# Patient Record
Sex: Male | Born: 1975 | Race: White | Hispanic: No | Marital: Married | Smoking: Current every day smoker
Health system: Southern US, Community
[De-identification: ages and names within clinical notes are randomized; demographics above are authoritative.]

## PROBLEM LIST (undated history)

## (undated) DIAGNOSIS — F909 Attention-deficit hyperactivity disorder, unspecified type: Secondary | ICD-10-CM

## (undated) HISTORY — DX: Attention-deficit hyperactivity disorder, unspecified type: F90.9

## (undated) HISTORY — PX: WISDOM TOOTH EXTRACTION: SHX21

---

## 2008-02-04 ENCOUNTER — Emergency Department: Payer: Self-pay | Admitting: Emergency Medicine

## 2009-10-24 ENCOUNTER — Emergency Department: Payer: Self-pay | Admitting: Emergency Medicine

## 2009-12-07 ENCOUNTER — Emergency Department: Payer: Self-pay | Admitting: Emergency Medicine

## 2011-03-31 IMAGING — CT CT CERVICAL SPINE WITHOUT CONTRAST
1 series · 12 of 14 positions shown, 15 images · non-contrast
Comparison: none

REASON FOR EXAM: hyperflexion injury to neck
COMMENTS:   LMP: (Male)

PROCEDURE:     CT  - CT CERVICAL SPINE WO  - October 24, 2009  [DATE]
RESULT:     CT cervical spine
TECHNIQUE: Helical 2 mm sections were obtained. Reconstructions were
performed utilizing a bone algorithm in coronal, sagittal, and axial planes.

[Series 7: axial · axial · 0.23mm/px · z∈[+385,+567]mm · 12 of 111 slices shown, 15 images]
[im 9/111  soft-tissue]
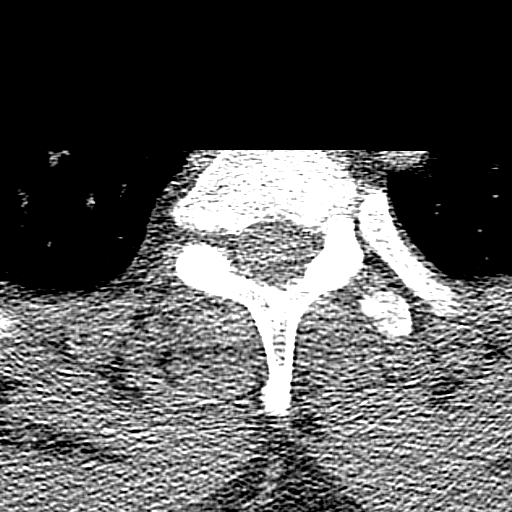
[im 9/111  bone]
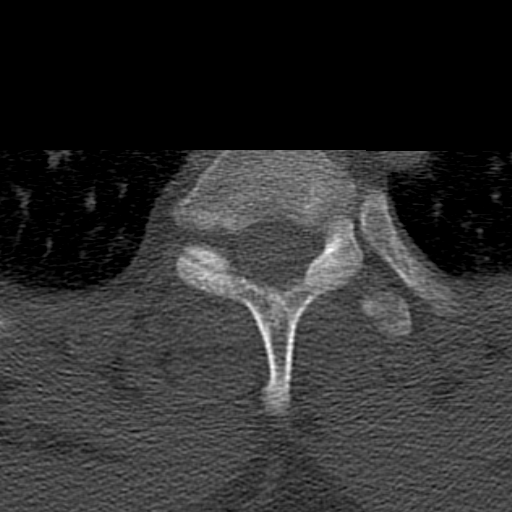
[im 17/111  bone]
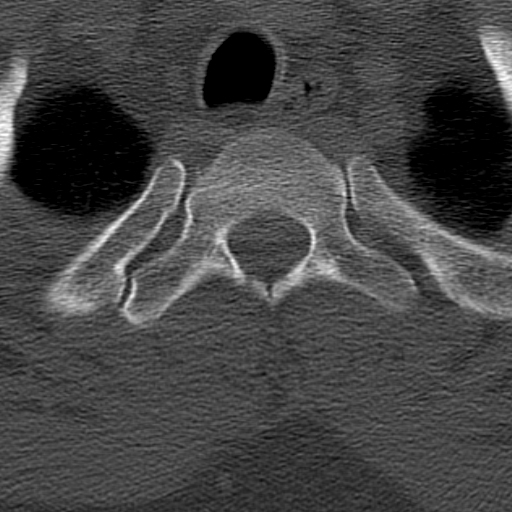
[im 26/111  bone]
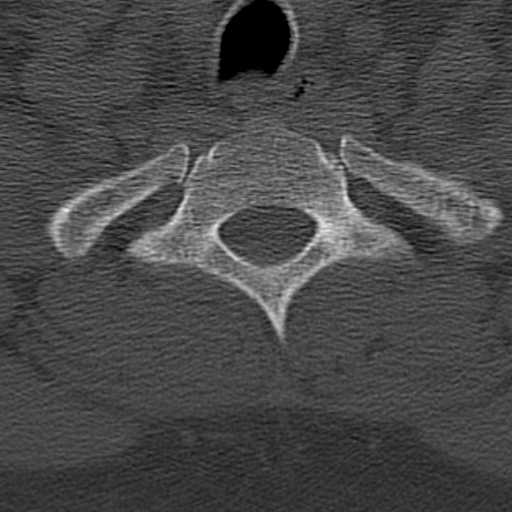
[im 34/111  bone]
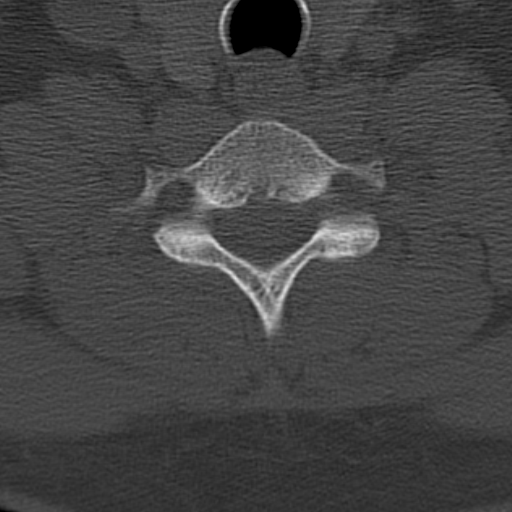
[im 43/111  soft-tissue]
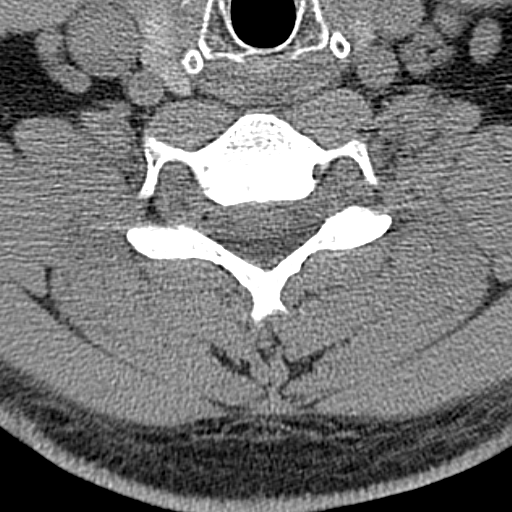
[im 43/111  bone]
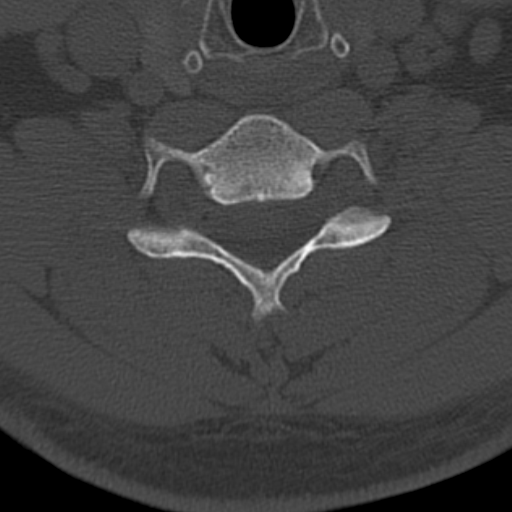
[im 51/111  bone]
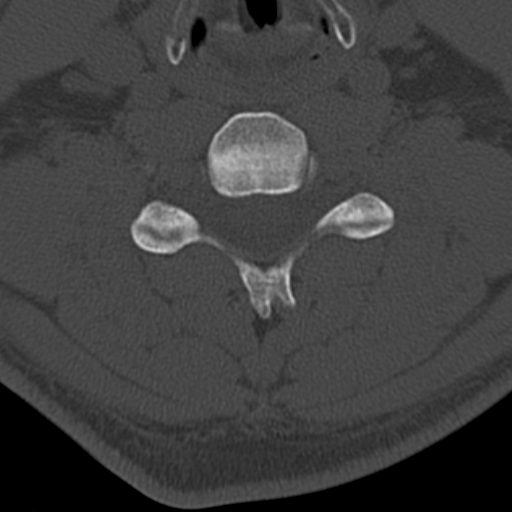
[im 60/111  bone]
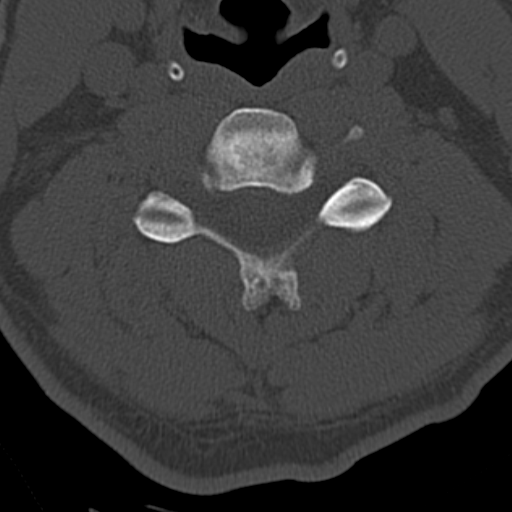
[im 68/111  bone]
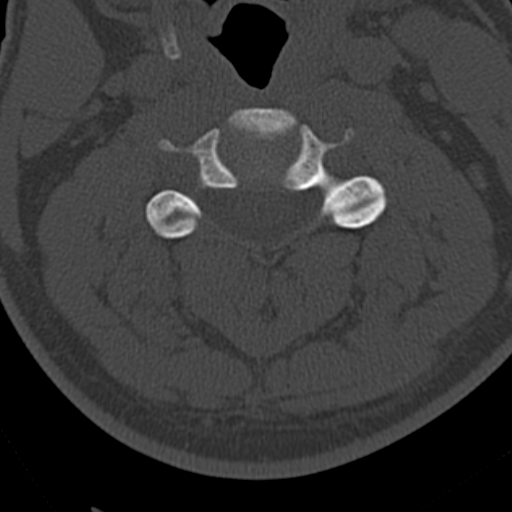
[im 77/111  soft-tissue]
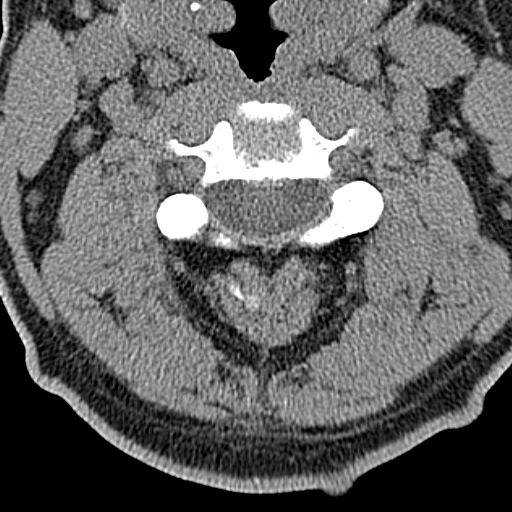
[im 77/111  bone]
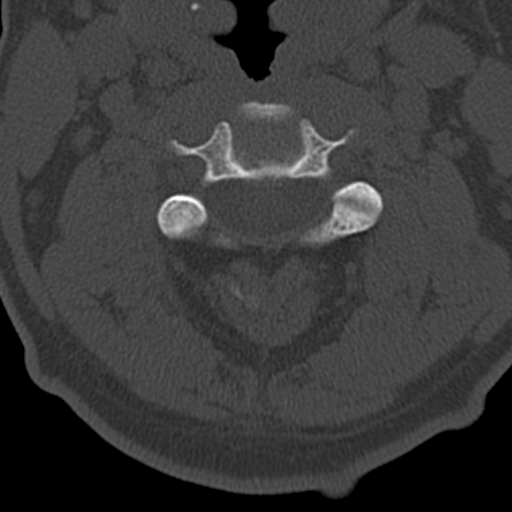
[im 85/111  bone]
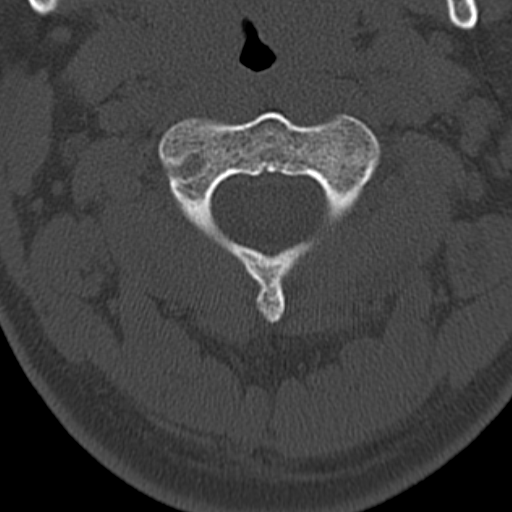
[im 94/111  bone]
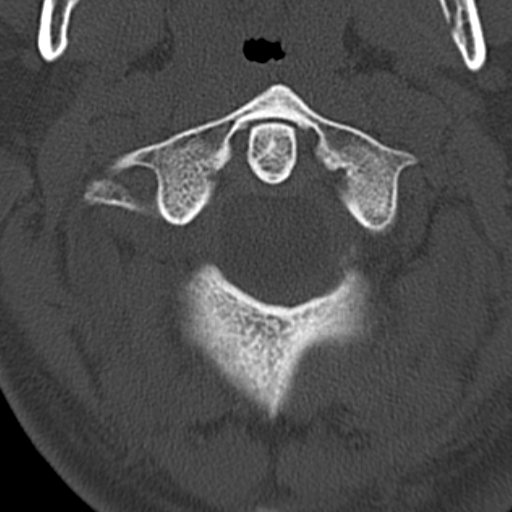
[im 102/111  bone]
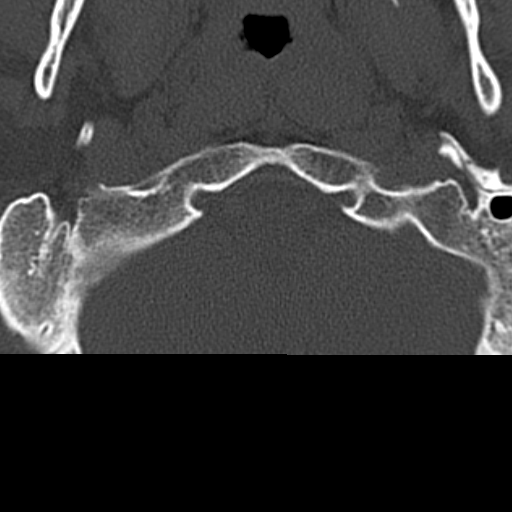

[12 of 14 positions shown; findings below may reference images not displayed]

FINDINGS: There is no evidence of acute fracture, dislocation, or
malalignment. There is no evidence of prevertebral soft tissue swelling nor
evidence of canal stenosis. There is straightening of the normal cervical
lordosis which may be secondary to positioning, collar placement, and/or
possibly muscle spasm.
IMPRESSION: No CT evidence of acute osseous abnormalities.

## 2011-05-15 IMAGING — CR DG CHEST 2V
1 series · 3 of 3 positions shown · non-contrast
Comparison: none

REASON FOR EXAM: need pa and lat for chest pain
COMMENTS:

PROCEDURE:     DXR - DXR CHEST PA (OR AP) AND LATERAL  - December 08, 2009  [DATE]
RESULT:     Comparison: None

[Series 1: view not recorded · 0.17mm/px · 3 of 3 slices shown]
[im 1/3]
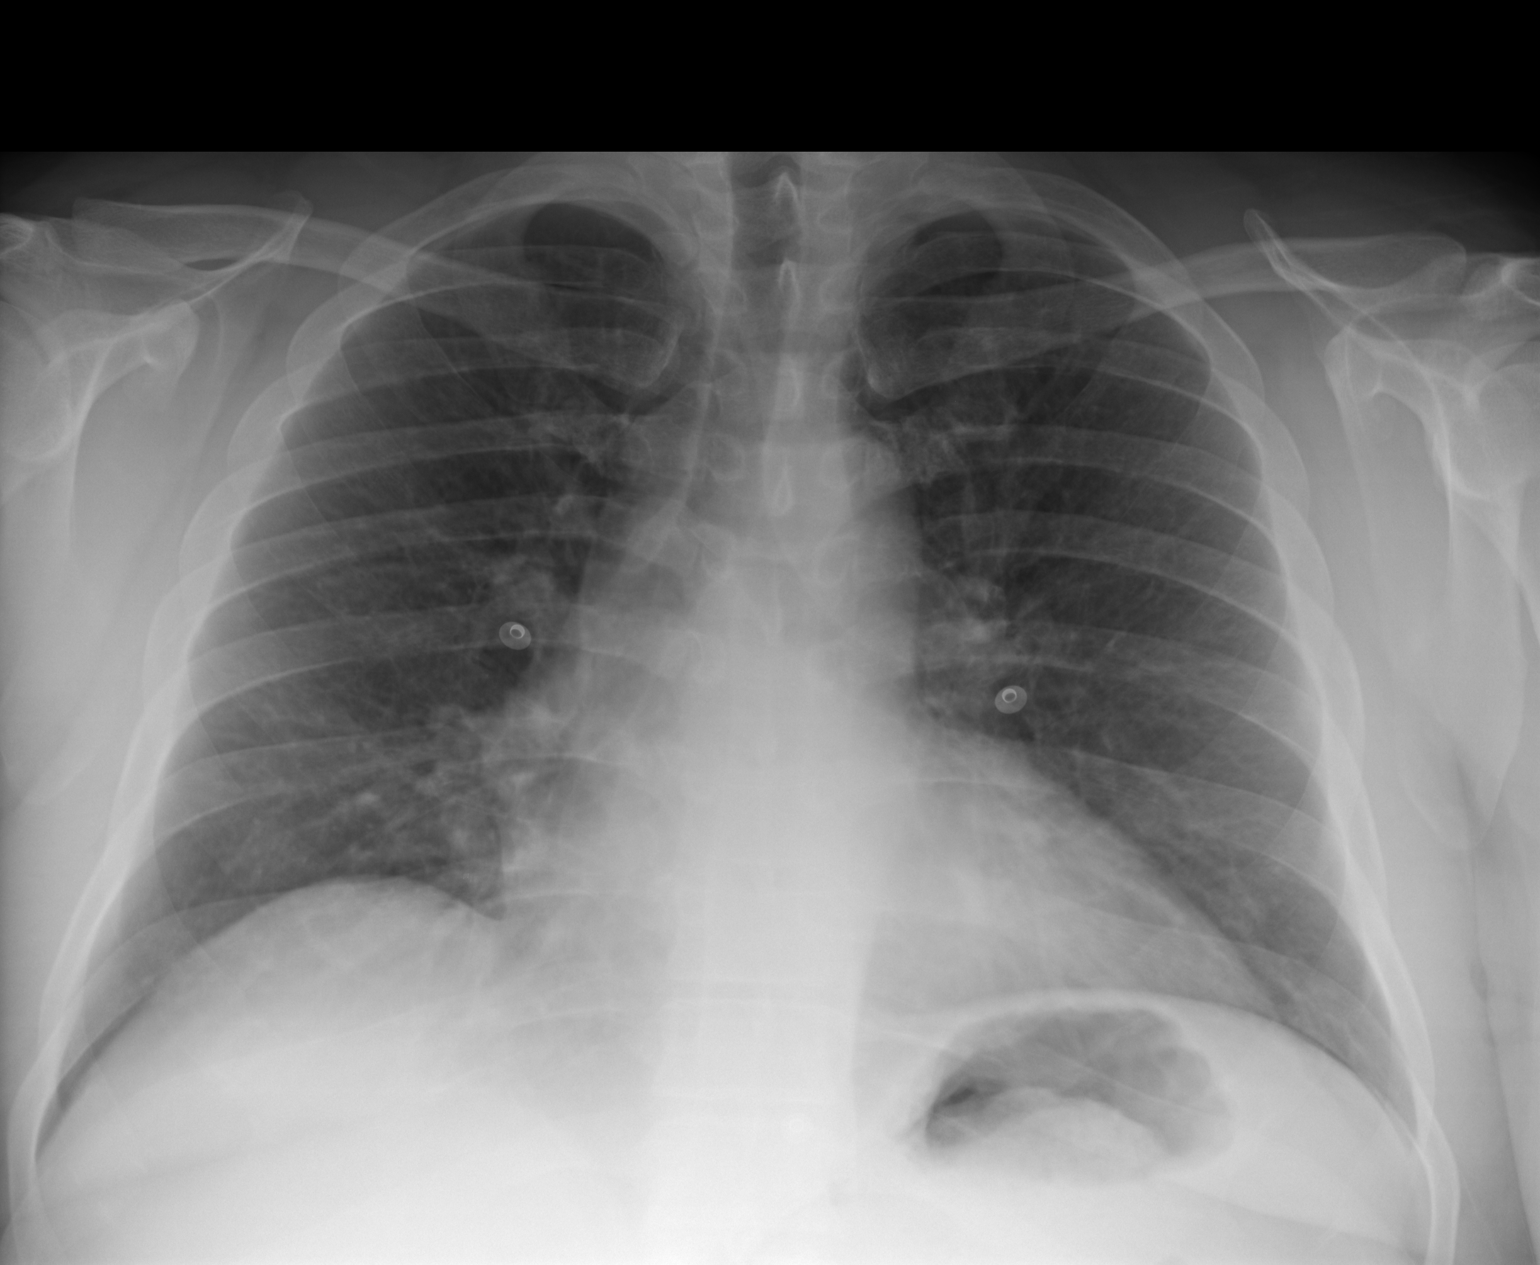
[im 2/3]
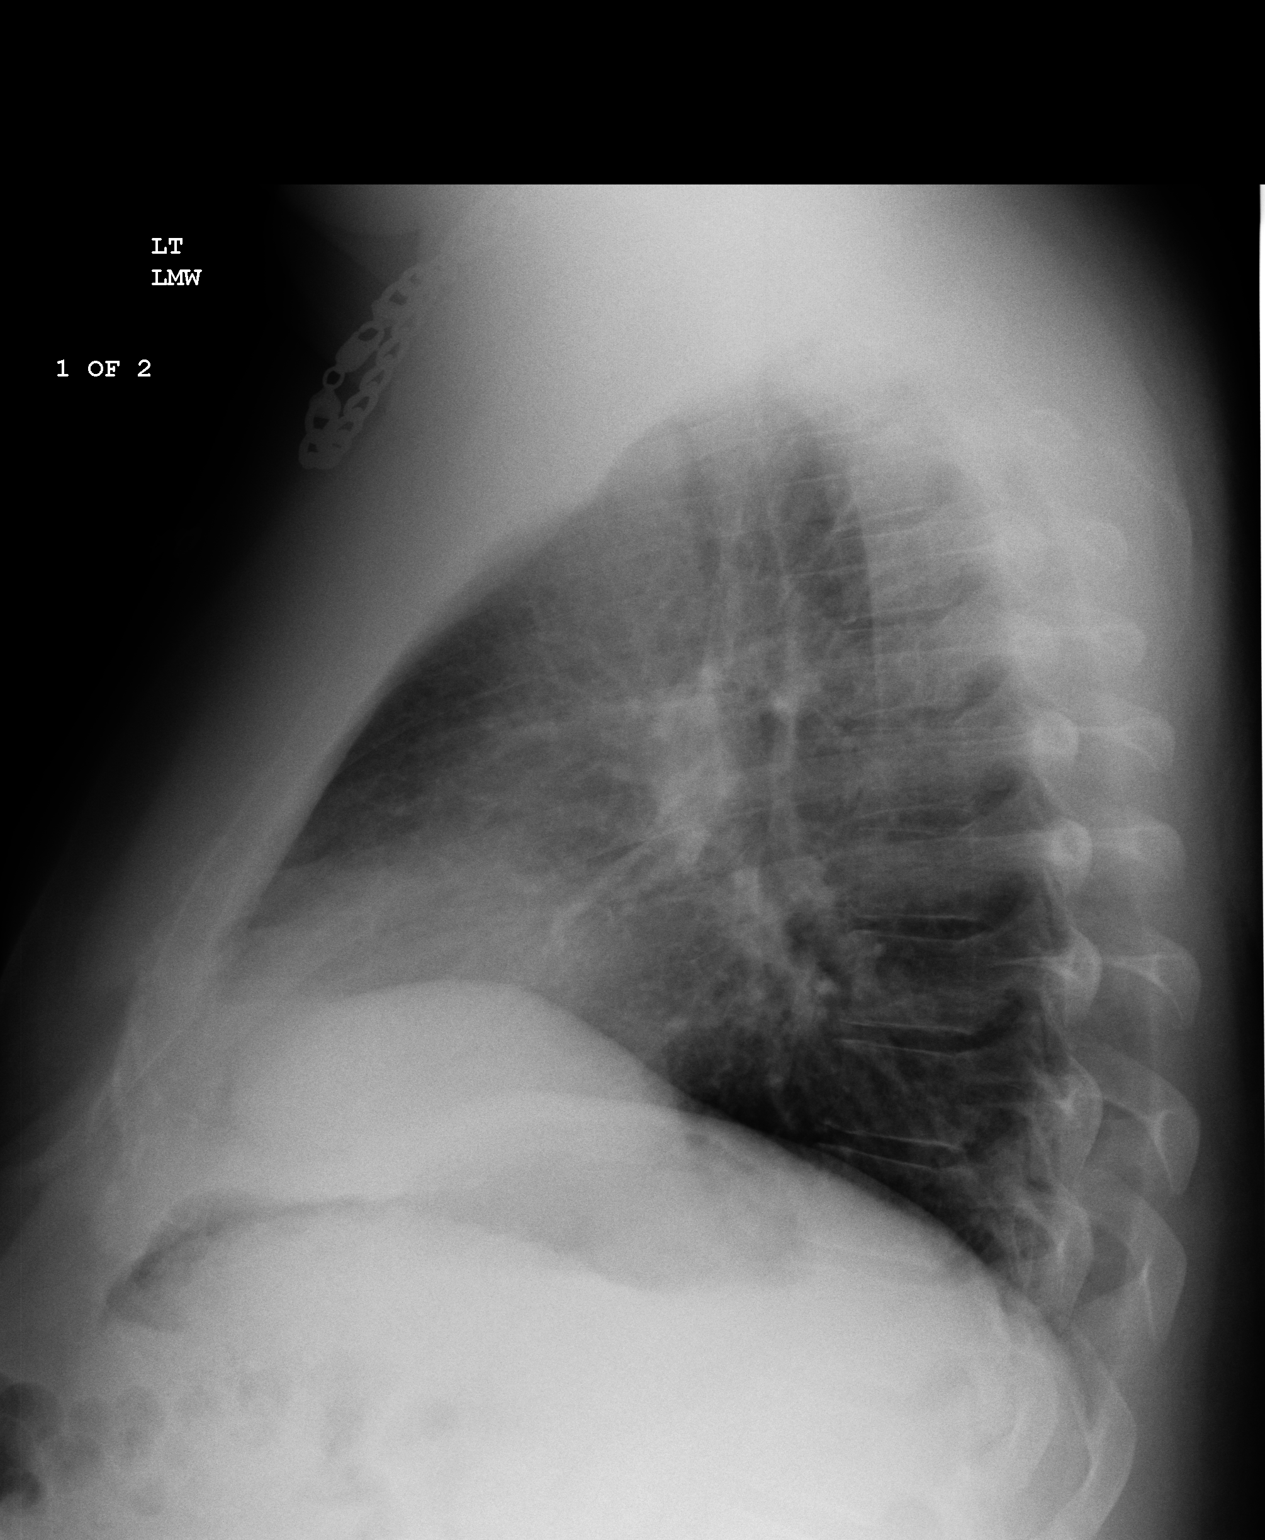
[im 3/3]
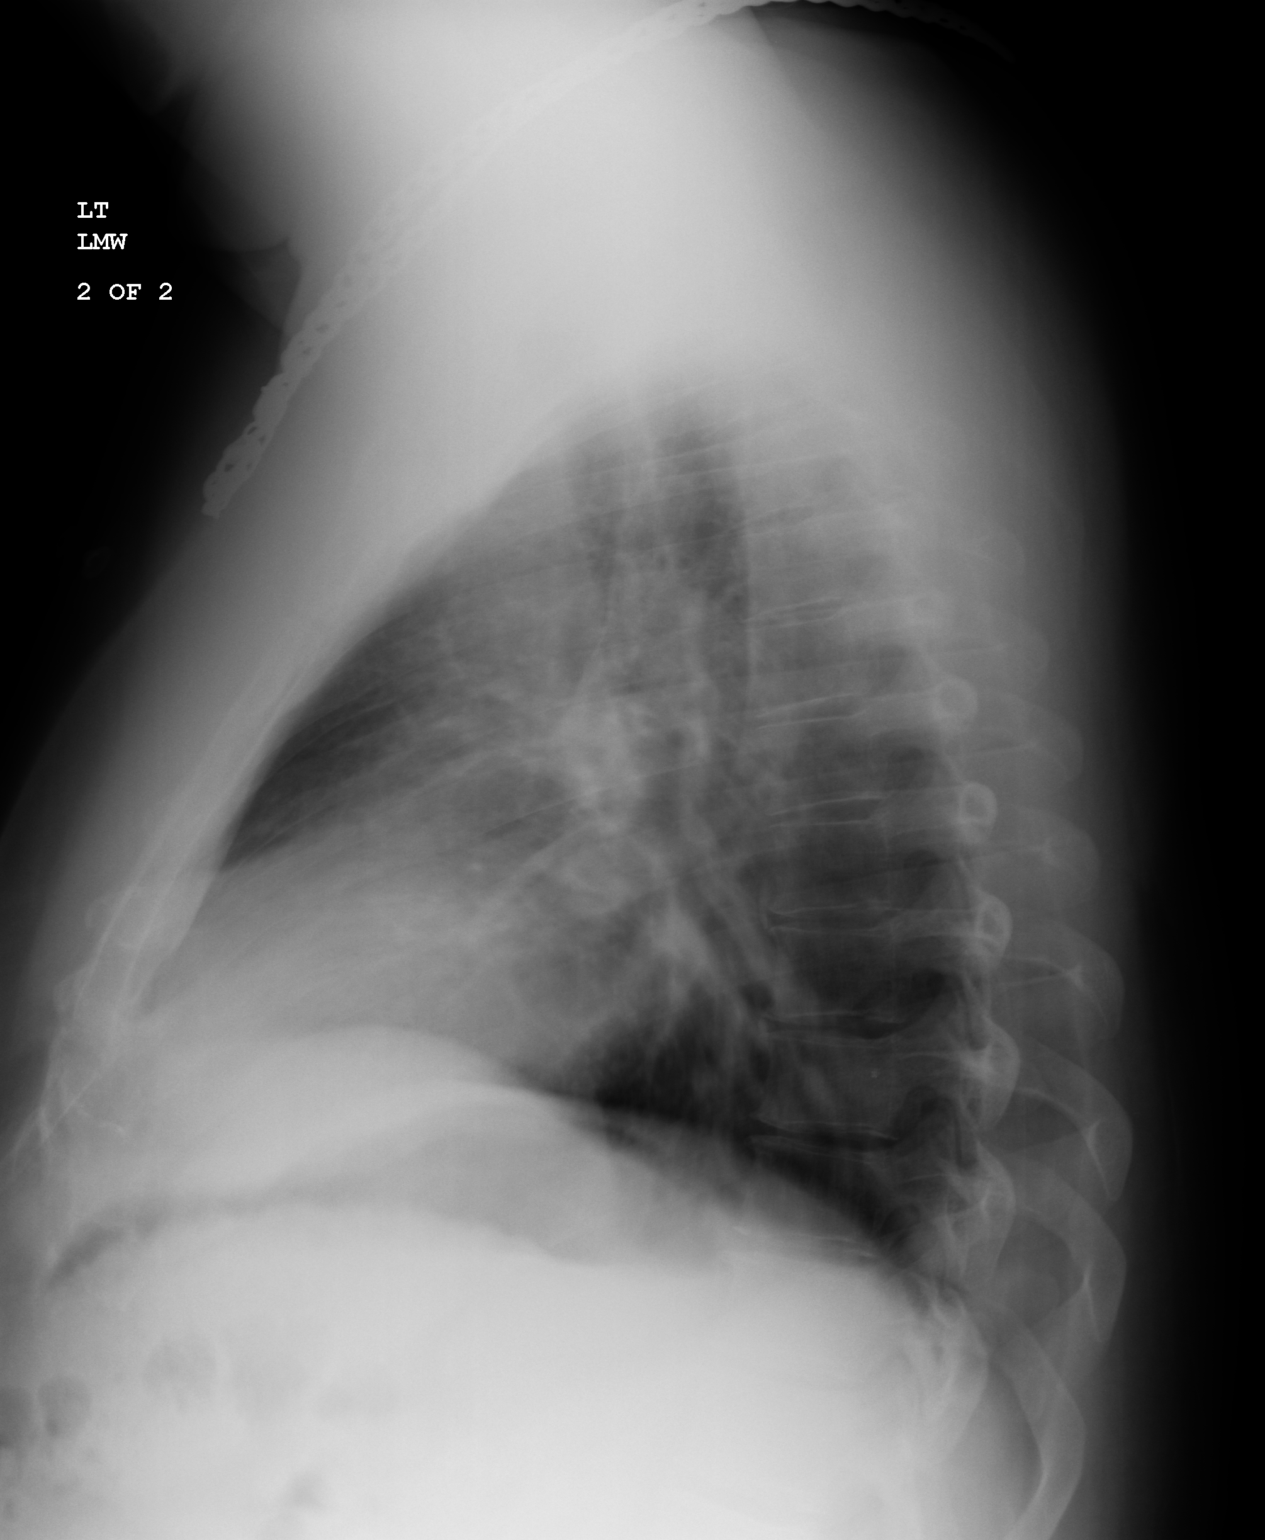

[3 of 3 positions shown; findings below may reference images not displayed]

FINDINGS: Heart size is mildly prominent, likely related to low lung volumes. The
lungs are clear.
IMPRESSION: No acute cardio pulmonary disease.

## 2017-10-02 ENCOUNTER — Ambulatory Visit: Payer: Managed Care, Other (non HMO) | Admitting: Family Medicine

## 2017-10-02 ENCOUNTER — Encounter: Payer: Self-pay | Admitting: Family Medicine

## 2017-10-02 VITALS — BP 118/84 | HR 77 | Temp 97.4°F | Resp 16 | Ht 69.25 in | Wt 322.0 lb

## 2017-10-02 DIAGNOSIS — Z720 Tobacco use: Secondary | ICD-10-CM | POA: Diagnosis not present

## 2017-10-02 DIAGNOSIS — F909 Attention-deficit hyperactivity disorder, unspecified type: Secondary | ICD-10-CM

## 2017-10-02 DIAGNOSIS — L409 Psoriasis, unspecified: Secondary | ICD-10-CM

## 2017-10-02 DIAGNOSIS — Z1329 Encounter for screening for other suspected endocrine disorder: Secondary | ICD-10-CM

## 2017-10-02 DIAGNOSIS — Z13220 Encounter for screening for lipoid disorders: Secondary | ICD-10-CM | POA: Diagnosis not present

## 2017-10-02 DIAGNOSIS — F988 Other specified behavioral and emotional disorders with onset usually occurring in childhood and adolescence: Secondary | ICD-10-CM | POA: Insufficient documentation

## 2017-10-02 DIAGNOSIS — Z6841 Body Mass Index (BMI) 40.0 and over, adult: Secondary | ICD-10-CM

## 2017-10-02 DIAGNOSIS — E669 Obesity, unspecified: Secondary | ICD-10-CM | POA: Insufficient documentation

## 2017-10-02 MED ORDER — CLOBETASOL PROPIONATE 0.05 % EX OINT: 1.0000 "application " | TOPICAL_OINTMENT | Freq: Two times a day (BID) | CUTANEOUS | 5 refills | Status: DC

## 2017-10-02 NOTE — Assessment & Plan Note (Signed)
Discussed importance of diet and exercise Will screen for lipids, diabetes BP well controlled

## 2017-10-02 NOTE — Assessment & Plan Note (Signed)
Reported diagnosis Does have classic symptoms of inattention and distractability Will send for Neuropsych testing to confirm and then f/u to discuss medications Would try Ritalin as this has worked well in the past

## 2017-10-02 NOTE — Assessment & Plan Note (Signed)
Encourage hydration and moisturizer Clobetasol ointment BID to affected areas No signs/symptoms of PsA If covers larger area in the future, consider Derm referral for possible biologic therapy

## 2017-10-02 NOTE — Patient Instructions (Signed)
Psoriasis Psoriasis is a long-term (chronic) condition of skin inflammation. It occurs because your immune system causes skin cells to form too quickly. As a result, too many skin cells grow and create raised, red patches (plaques) that look silvery on your skin. Plaques may appear anywhere on your body. They can be any size or shape. Psoriasis can come and go. The condition varies from mild to very severe. It cannot be passed from one person to another (not contagious). What are the causes? The cause of psoriasis is not known, but certain factors can make the condition worse. These include:  Damage or trauma to the skin, such as cuts, scrapes, sunburn, and dryness.  Lack of sunlight.  Certain medicines.  Alcohol.  Tobacco use.  Stress.  Infections caused by bacteria or viruses.  What increases the risk? This condition is more likely to develop in:  People with a family history of psoriasis.  People who are Caucasian.  People who are between the ages of 15-30 and 50-60 years old.  What are the signs or symptoms? There are five different types of psoriasis. You can have more than one type of psoriasis during your life. Types are:  Plaque.  Guttate.  Inverse.  Pustular.  Erythrodermic.  Each type of psoriasis has different symptoms.  Plaque psoriasis symptoms include red, raised plaques with a silvery white coating (scale). These plaques may be itchy. Your nails may be pitted and crumbly or fall off.  Guttate psoriasis symptoms include small red spots that often show up on your trunk, arms, and legs. These spots may develop after you have been sick, especially with strep throat.  Inverse psoriasis symptoms include plaques in your underarm area, under your breasts, or on your genitals, groin, or buttocks.  Pustular psoriasis symptoms include pus-filled bumps that are painful, red, and swollen on the palms of your hands or the soles of your feet. You also may feel  exhausted, feverish, weak, or have no appetite.  Erythrodermic psoriasis symptoms include bright red skin that may look burned. You may have a fast heartbeat and a body temperature that is too high or too low. You may be itchy or in pain.  How is this diagnosed? Your health care provider may suspect psoriasis based on your symptoms and family history. Your health care provider will also do a physical exam. This may include a procedure to remove a tissue sample (biopsy) for testing. You may also be referred to a health care provider who specializes in skin diseases (dermatologist). How is this treated? There is no cure for this condition, but treatment can help manage it. Goals of treatment include:  Helping your skin heal.  Reducing itching and inflammation.  Slowing the growth of new skin cells.  Helping your immune system respond better to your skin.  Treatment varies, depending on the severity of your condition. Treatment may include:  Creams or ointments.  Ultraviolet ray exposure (light therapy). This may include natural sunlight or light therapy in a medical office.  Medicines (systemic therapy). These medicines can help your body better manage skin cell turnover and inflammation. They may be used along with light therapy or ointments. You may also get antibiotic medicines if you have an infection.  Follow these instructions at home: Skin Care  Moisturize your skin as needed. Only use moisturizers that have been approved by your health care provider.  Apply cool compresses to the affected areas.  Do not scratch your skin. Lifestyle   Do not   use tobacco products. This includes cigarettes, chewing tobacco, and e-cigarettes. If you need help quitting, ask your health care provider.  Drink little or no alcohol.  Try techniques for stress reduction, such as meditation or yoga.  Get exposure to the sun as told by your health care provider. Do not get sunburned.  Consider  joining a psoriasis support group. Medicines  Take or use over-the-counter and prescription medicines only as told by your health care provider.  If you were prescribed an antibiotic, take or use it as told by your health care provider. Do not stop taking the antibiotic even if your condition starts to improve. General instructions  Keep a journal to help track what triggers an outbreak. Try to avoid any triggers.  See a counselor or social worker if feelings of sadness, frustration, and hopelessness about your condition are interfering with your work and relationships.  Keep all follow-up visits as told by your health care provider. This is important. Contact a health care provider if:  Your pain gets worse.  You have increasing redness or warmth in the affected areas.  You have new or worsening pain or stiffness in your joints.  Your nails start to break easily or pull away from the nail bed.  You have a fever.  You feel depressed. This information is not intended to replace advice given to you by your health care provider. Make sure you discuss any questions you have with your health care provider. Document Released: 02/15/2000 Document Revised: 07/26/2015 Document Reviewed: 07/05/2014 Elsevier Interactive Patient Education  2018 Elsevier Inc[employer]  

## 2017-10-02 NOTE — Progress Notes (Signed)
Patient: Bradley Scott, Male    DOB: [birth year], 42 y.o.   MRN: [id number] Visit Date: 10/02/2017  Today's Provider: Shirlee Latch, MD   I, Joslyn Hy, CMA, am acting as scribe for Shirlee Latch, MD.  Chief Complaint  Patient presents with  . New Patient (Initial Visit)   Subjective:    New Patient Bradley Scott is a 42 y.o. male who presents today as a new patient to establish care. He feels well. He reports exercising some. Play disc golf on the weekends. He reports he is sleeping fairly well.  No recent PCP.  Pt was previously on Ritalin for ADHD. 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 His attention is worsening, and he feels a need an rx for this.  Obesity: Working on weight loss. Knows that he needs to lose weight.  His dad and uncles are all obese.  Trying to exercise more (disc golf) and cut back on portion size.  Tobacco use: Started smoking in 2009.  Smoking about 1 ppd.  Interested in quitting.  Feels like it is a habit at this time.  Has quit for 3 years in the past with cold [place].  Thinks he might do this again in the future.  Psoriasis: was previously diagnosed by a Dermatologist.  Has been using some topical steroid cream but doesn't seem to improve. Is on elbows and sides of rib cage.  Itchy. -----------------------------------------------------------------   Review of Systems  Constitutional: Negative.   HENT: Negative.   Eyes: Negative.   Respiratory: Negative.   Cardiovascular: Negative.   Gastrointestinal: Negative.   Endocrine: Negative.   Genitourinary: Negative.   Musculoskeletal: Negative.   Skin: Negative.   Allergic/Immunologic: Negative.   Neurological: Negative.   Hematological: Negative.   Psychiatric/Behavioral: Positive for decreased concentration and sleep disturbance. Negative for agitation, behavioral  problems, confusion, dysphoric mood, hallucinations, self-injury and suicidal ideas. The patient is nervous/anxious. The patient is not hyperactive.     Social History      He  reports that he has been smoking cigarettes.  He has a 10.00 pack-year smoking history. He has never used smokeless tobacco. He reports that he drinks alcohol. He reports that he does not use drugs.       Social History   Socioeconomic History  . Marital status: Married    Spouse name: Florentina Addison  . Number of children: 2  . Years of education: 40  . Highest education level: High school graduate  Occupational History  . Occupation: Barrister's clerk: Company secretary  Social Needs  . Financial resource strain: Not on file  . Food insecurity:    Worry: Not on file    Inability: Not on file  . Transportation needs:    Medical: Not on file    Non-medical: Not on file  Tobacco Use  . Smoking status: Current Every Day Smoker    Packs/day: 1.00    Years: 10.00    Pack years: 10.00    Types: Cigarettes  . Smokeless tobacco: Never Used  . Tobacco comment: started smoking in 2009.  Substance and Sexual Activity  . Alcohol use: Yes    Frequency: Never    Comment: very occasional, socially  . Drug use: Never  . Sexual activity: Yes    Partners: Female  Lifestyle  .  Physical activity:    Days per week: Not on file    Minutes per session: Not on file  . Stress: Not on file  Relationships  . Social connections:    Talks on phone: Not on file    Gets together: Not on file    Attends religious service: Not on file    Active member of club or organization: Not on file    Attends meetings of clubs or organizations: Not on file    Relationship status: Not on file  Other Topics Concern  . Not on file  Social History Narrative  . Not on file    Past Medical History:  Diagnosis Date  . ADHD      Patient Active Problem List   Diagnosis Date Noted  . ADD (attention deficit disorder)  10/02/2017  . Obesity 10/02/2017  . Psoriasis 10/02/2017    Past Surgical History:  Procedure Laterality Date  . WISDOM TOOTH EXTRACTION      Family History        Family Status  Relation Name Status  . Mother  Alive  . Father  Alive  . Son  (Not Specified)  . Neg Hx  (Not Specified)        His family history includes ADD / ADHD in his son; Breast cancer (age of onset: 82) in his mother; Obesity in his father. There is no history of Colon cancer or Prostate cancer.      No Known Allergies   Current Outpatient Medications:  .  clobetasol ointment (TEMOVATE) 0.05 %, Apply 1 application topically 2 (two) times daily., Disp: 60 g, Rfl: 5   Patient Care Team: Erasmo Downer, MD as PCP - General (Family Medicine)      Objective:   Vitals: BP 118/84 (BP Location: Left Arm, Patient Position: Sitting, Cuff Size: Large)   Pulse 77   Temp (!) 97.4 F (36.3 C) (Oral)   Resp 16   Ht 5' 9.25" (1.759 m)   Wt (!) 322 lb (146.1 kg)   SpO2 96%   BMI 47.21 kg/m    Vitals:   10/02/17 1018  BP: 118/84  Pulse: 77  Resp: 16  Temp: (!) 97.4 F (36.3 C)  TempSrc: Oral  SpO2: 96%  Weight: (!) 322 lb (146.1 kg)  Height: 5' 9.25" (1.759 m)     Physical Exam  Constitutional: He is oriented to person, place, and time. He appears well-developed and well-nourished. No distress.  HENT:  Head: Normocephalic and atraumatic.  Right Ear: External ear normal.  Left Ear: External ear normal.  Nose: Nose normal.  Mouth/Throat: Oropharynx is clear and moist.  Eyes: Pupils are equal, round, and reactive to light. Conjunctivae and EOM are normal. No scleral icterus.  Neck: Neck supple. No thyromegaly present.  Cardiovascular: Normal rate, regular rhythm, normal heart sounds and intact distal pulses.  No murmur heard. Pulmonary/Chest: Breath sounds normal. No respiratory distress. He has no wheezes. He has no rales.  Abdominal: Soft. Bowel sounds are normal. He exhibits no  distension. There is no tenderness. There is no rebound and no guarding.  Musculoskeletal: He exhibits no edema or deformity.  Lymphadenopathy:    He has no cervical adenopathy.  Neurological: He is alert and oriented to person, place, and time.  Skin: Skin is warm and dry. Capillary refill takes less than 2 seconds. Rash (psoriasis of b/l elbows and sides) noted.  Psychiatric: He has a normal mood and affect. His behavior is normal.  Thought content normal.  Vitals reviewed.    Depression Screen PHQ 2/9 Scores 10/02/2017  PHQ - 2 Score 0     Assessment & Plan:    Problem List Items Addressed This Visit      Musculoskeletal and Integument   Psoriasis    Encourage hydration and moisturizer Clobetasol ointment BID to affected areas No signs/symptoms of PsA If covers larger area in the future, consider Derm referral for possible biologic therapy        Other   ADD (attention deficit disorder) - Primary    Reported diagnosis Does have classic symptoms of inattention and distractability Will send for Neuropsych testing to confirm and then f/u to discuss medications Would try Ritalin as this has worked well in the past      Relevant Orders   Ambulatory referral to Neuropsychology   Obesity    Discussed importance of diet and exercise Will screen for lipids, diabetes BP well controlled      Relevant Orders   Comprehensive metabolic panel   Lipid panel   Ambulatory referral to Neuropsychology   TSH   Tobacco abuse    3 to 5-minute discussion with the patient regarding the harms of continuing smoking, including heart disease, lung disease, cancer risk, secondhand smoke Also discussed medication options to help with cessation He is not yet ready to quit and believes he will be able to quit with cold [place] in the future when he is ready He believes he needs to get his head in the right space before he can attempt to quit       Other Visit Diagnoses    Screening for  thyroid disorder       Relevant Orders   TSH   Screening for lipid disorders       Relevant Orders   Lipid panel       Return in about 2 months (around 12/02/2017) for ADD f/u.   The entirety of the information documented in the History of Present Illness, Review of Systems and Physical Exam were personally obtained by me. Portions of this information were initially documented by Irving BurtonEmily Ratchford, CMA and reviewed by me for thoroughness and accuracy.    Erasmo DownerBacigalupo, Paquita Printy M, MD, MPH [facility] 10/02/2017 11:49 AM

## 2017-10-02 NOTE — Assessment & Plan Note (Signed)
3 to 5-minute discussion with the patient regarding the harms of continuing smoking, including heart disease, lung disease, cancer risk, secondhand smoke Also discussed medication options to help with cessation He is not yet ready to quit and believes he will be able to quit with cold [place] in the future when he is ready He believes he needs to get his head in the right space before he can attempt to quit

## 2017-12-02 ENCOUNTER — Ambulatory Visit: Payer: Self-pay | Admitting: Family Medicine

## 2017-12-08 ENCOUNTER — Ambulatory Visit: Payer: Self-pay | Admitting: Family Medicine

## 2023-03-04 ENCOUNTER — Encounter
Admission: EM | Disposition: A | Discharge status: Other Acute Inpatient Hospital | Payer: Self-pay | Source: Home / Self Care | Attending: Internal Medicine

## 2023-03-04 ENCOUNTER — Inpatient Hospital Stay
Admission: EM | Admit: 2023-03-04 | Discharge: 2023-03-04 | DRG: 322 | Disposition: A | Discharge status: Other Acute Inpatient Hospital | Payer: BC Managed Care – PPO | Attending: Internal Medicine | Admitting: Internal Medicine

## 2023-03-04 ENCOUNTER — Encounter (HOSPITAL_COMMUNITY): Payer: Self-pay

## 2023-03-04 ENCOUNTER — Inpatient Hospital Stay (HOSPITAL_COMMUNITY)
Admission: EM | Admit: 2023-03-04 | Discharge: 2023-03-06 | DRG: 252 | Disposition: A | Discharge status: Home / Self Care | Payer: BC Managed Care – PPO | Source: Other Acute Inpatient Hospital | Attending: Cardiology | Admitting: Cardiology

## 2023-03-04 ENCOUNTER — Other Ambulatory Visit: Payer: Self-pay

## 2023-03-04 DIAGNOSIS — E1159 Type 2 diabetes mellitus with other circulatory complications: Secondary | ICD-10-CM | POA: Insufficient documentation

## 2023-03-04 DIAGNOSIS — Z7902 Long term (current) use of antithrombotics/antiplatelets: Secondary | ICD-10-CM

## 2023-03-04 DIAGNOSIS — I724 Aneurysm of artery of lower extremity: Secondary | ICD-10-CM | POA: Diagnosis not present

## 2023-03-04 DIAGNOSIS — Z9861 Coronary angioplasty status: Secondary | ICD-10-CM | POA: Diagnosis not present

## 2023-03-04 DIAGNOSIS — M79604 Pain in right leg: Secondary | ICD-10-CM | POA: Diagnosis not present

## 2023-03-04 DIAGNOSIS — Z803 Family history of malignant neoplasm of breast: Secondary | ICD-10-CM | POA: Diagnosis not present

## 2023-03-04 DIAGNOSIS — Z7984 Long term (current) use of oral hypoglycemic drugs: Secondary | ICD-10-CM

## 2023-03-04 DIAGNOSIS — Z79899 Other long term (current) drug therapy: Secondary | ICD-10-CM

## 2023-03-04 DIAGNOSIS — E785 Hyperlipidemia, unspecified: Secondary | ICD-10-CM | POA: Diagnosis present

## 2023-03-04 DIAGNOSIS — E118 Type 2 diabetes mellitus with unspecified complications: Secondary | ICD-10-CM | POA: Diagnosis present

## 2023-03-04 DIAGNOSIS — I5020 Unspecified systolic (congestive) heart failure: Secondary | ICD-10-CM | POA: Diagnosis not present

## 2023-03-04 DIAGNOSIS — I2510 Atherosclerotic heart disease of native coronary artery without angina pectoris: Secondary | ICD-10-CM | POA: Diagnosis present

## 2023-03-04 DIAGNOSIS — Z720 Tobacco use: Secondary | ICD-10-CM | POA: Diagnosis present

## 2023-03-04 DIAGNOSIS — F17210 Nicotine dependence, cigarettes, uncomplicated: Secondary | ICD-10-CM | POA: Diagnosis present

## 2023-03-04 DIAGNOSIS — I252 Old myocardial infarction: Secondary | ICD-10-CM | POA: Diagnosis not present

## 2023-03-04 DIAGNOSIS — E782 Mixed hyperlipidemia: Secondary | ICD-10-CM | POA: Insufficient documentation

## 2023-03-04 DIAGNOSIS — R578 Other shock: Secondary | ICD-10-CM | POA: Diagnosis not present

## 2023-03-04 DIAGNOSIS — G4733 Obstructive sleep apnea (adult) (pediatric): Secondary | ICD-10-CM | POA: Diagnosis present

## 2023-03-04 DIAGNOSIS — Z818 Family history of other mental and behavioral disorders: Secondary | ICD-10-CM

## 2023-03-04 DIAGNOSIS — I10 Essential (primary) hypertension: Secondary | ICD-10-CM | POA: Diagnosis not present

## 2023-03-04 DIAGNOSIS — M7981 Nontraumatic hematoma of soft tissue: Secondary | ICD-10-CM | POA: Diagnosis not present

## 2023-03-04 DIAGNOSIS — I213 ST elevation (STEMI) myocardial infarction of unspecified site: Secondary | ICD-10-CM | POA: Diagnosis present

## 2023-03-04 DIAGNOSIS — I2101 ST elevation (STEMI) myocardial infarction involving left main coronary artery: Secondary | ICD-10-CM | POA: Diagnosis not present

## 2023-03-04 DIAGNOSIS — I2109 ST elevation (STEMI) myocardial infarction involving other coronary artery of anterior wall: Secondary | ICD-10-CM | POA: Diagnosis present

## 2023-03-04 DIAGNOSIS — Z7982 Long term (current) use of aspirin: Secondary | ICD-10-CM | POA: Diagnosis not present

## 2023-03-04 DIAGNOSIS — I729 Aneurysm of unspecified site: Secondary | ICD-10-CM | POA: Diagnosis not present

## 2023-03-04 DIAGNOSIS — Z452 Encounter for adjustment and management of vascular access device: Secondary | ICD-10-CM | POA: Diagnosis not present

## 2023-03-04 DIAGNOSIS — R000 Tachycardia, unspecified: Secondary | ICD-10-CM | POA: Diagnosis not present

## 2023-03-04 DIAGNOSIS — T82593A Other mechanical complication of balloon (counterpulsation) device, initial encounter: Principal | ICD-10-CM | POA: Diagnosis present

## 2023-03-04 DIAGNOSIS — I2102 ST elevation (STEMI) myocardial infarction involving left anterior descending coronary artery: Principal | ICD-10-CM | POA: Diagnosis present

## 2023-03-04 DIAGNOSIS — T81718A Complication of other artery following a procedure, not elsewhere classified, initial encounter: Secondary | ICD-10-CM | POA: Diagnosis not present

## 2023-03-04 DIAGNOSIS — E66813 Obesity, class 3: Secondary | ICD-10-CM | POA: Diagnosis not present

## 2023-03-04 DIAGNOSIS — Z955 Presence of coronary angioplasty implant and graft: Secondary | ICD-10-CM

## 2023-03-04 DIAGNOSIS — Z6841 Body Mass Index (BMI) 40.0 and over, adult: Secondary | ICD-10-CM

## 2023-03-04 DIAGNOSIS — S301XXA Contusion of abdominal wall, initial encounter: Secondary | ICD-10-CM | POA: Diagnosis not present

## 2023-03-04 DIAGNOSIS — E1169 Type 2 diabetes mellitus with other specified complication: Secondary | ICD-10-CM | POA: Insufficient documentation

## 2023-03-04 DIAGNOSIS — R0989 Other specified symptoms and signs involving the circulatory and respiratory systems: Secondary | ICD-10-CM | POA: Diagnosis not present

## 2023-03-04 DIAGNOSIS — I509 Heart failure, unspecified: Secondary | ICD-10-CM | POA: Diagnosis not present

## 2023-03-04 DIAGNOSIS — F17200 Nicotine dependence, unspecified, uncomplicated: Secondary | ICD-10-CM | POA: Diagnosis not present

## 2023-03-04 DIAGNOSIS — K429 Umbilical hernia without obstruction or gangrene: Secondary | ICD-10-CM | POA: Diagnosis not present

## 2023-03-04 DIAGNOSIS — N3289 Other specified disorders of bladder: Secondary | ICD-10-CM | POA: Diagnosis not present

## 2023-03-04 HISTORY — PX: CORONARY/GRAFT ACUTE MI REVASCULARIZATION: CATH118305

## 2023-03-04 HISTORY — PX: LEFT HEART CATH AND CORONARY ANGIOGRAPHY: CATH118249

## 2023-03-04 HISTORY — PX: RIGHT HEART CATH: CATH118263

## 2023-03-04 LAB — CBC WITH DIFFERENTIAL/PLATELET
Abs Immature Granulocytes: 0.05 10*3/uL (ref 0.00–0.07)
Basophils Absolute: 0.1 10*3/uL (ref 0.0–0.1)
Basophils Relative: 1 %
Eosinophils Absolute: 0.1 10*3/uL (ref 0.0–0.5)
Eosinophils Relative: 1 %
HCT: 49.4 % (ref 39.0–52.0)
Hemoglobin: 16.8 g/dL (ref 13.0–17.0)
Immature Granulocytes: 1 %
Lymphocytes Relative: 25 %
Lymphs Abs: 2.6 10*3/uL (ref 0.7–4.0)
MCH: 31.2 pg (ref 26.0–34.0)
MCHC: 34 g/dL (ref 30.0–36.0)
MCV: 91.8 fL (ref 80.0–100.0)
Monocytes Absolute: 0.5 10*3/uL (ref 0.1–1.0)
Monocytes Relative: 5 %
Neutro Abs: 6.9 10*3/uL (ref 1.7–7.7)
Neutrophils Relative %: 67 %
Platelets: 182 10*3/uL (ref 150–400)
RBC: 5.38 MIL/uL (ref 4.22–5.81)
RDW: 13.2 % (ref 11.5–15.5)
WBC: 10.3 10*3/uL (ref 4.0–10.5)
nRBC: 0 % (ref 0.0–0.2)

## 2023-03-04 LAB — COMPREHENSIVE METABOLIC PANEL
ALT: 49 U/L — ABNORMAL HIGH (ref 0–44)
AST: 46 U/L — ABNORMAL HIGH (ref 15–41)
Albumin: 4.4 g/dL (ref 3.5–5.0)
Alkaline Phosphatase: 49 U/L (ref 38–126)
Anion gap: 13 (ref 5–15)
BUN: 18 mg/dL (ref 6–20)
CO2: 22 mmol/L (ref 22–32)
Calcium: 8.9 mg/dL (ref 8.9–10.3)
Chloride: 99 mmol/L (ref 98–111)
Creatinine, Ser: 0.99 mg/dL (ref 0.61–1.24)
GFR, Estimated: >60 mL/min (ref >60)
Glucose, Bld: 134 mg/dL — ABNORMAL HIGH (ref 70–99)
Potassium: 3.7 mmol/L (ref 3.5–5.1)
Sodium: 134 mmol/L — ABNORMAL LOW (ref 135–145)
Total Bilirubin: 1.1 mg/dL (ref 0.0–1.2)
Total Protein: 7.9 g/dL (ref 6.5–8.1)

## 2023-03-04 LAB — LIPID PANEL
Cholesterol: 198 mg/dL (ref 0–200)
HDL: 25 mg/dL — ABNORMAL LOW (ref >40)
LDL Cholesterol: 148 mg/dL — ABNORMAL HIGH (ref 0–99)
Total CHOL/HDL Ratio: 7.9 {ratio}
Triglycerides: 127 mg/dL (ref <150)
VLDL: 25 mg/dL (ref 0–40)

## 2023-03-04 LAB — APTT: aPTT: 38 s — ABNORMAL HIGH (ref 24–36)

## 2023-03-04 LAB — GLUCOSE, CAPILLARY: Glucose-Capillary: 153 mg/dL — ABNORMAL HIGH (ref 70–99)

## 2023-03-04 LAB — PROTIME-INR
INR: 1 (ref 0.8–1.2)
Prothrombin Time: 13.7 s (ref 11.4–15.2)

## 2023-03-04 LAB — POCT ACTIVATED CLOTTING TIME: Activated Clotting Time: 314 s

## 2023-03-04 LAB — TROPONIN I (HIGH SENSITIVITY): Troponin I (High Sensitivity): 243 ng/L (ref <18)

## 2023-03-04 SURGERY — CORONARY/GRAFT ACUTE MI REVASCULARIZATION
Anesthesia: Moderate Sedation

## 2023-03-04 MED ORDER — HEPARIN SODIUM (PORCINE) 1000 UNIT/ML IJ SOLN
INTRAMUSCULAR | Status: DC | PRN
  Administered 2023-03-04: 10000 [IU] via INTRAVENOUS

## 2023-03-04 MED ORDER — MIDAZOLAM HCL 2 MG/2ML IJ SOLN
INTRAMUSCULAR | Status: DC | PRN
  Administered 2023-03-04 (×4): 1 mg via INTRAVENOUS

## 2023-03-04 MED ORDER — TIROFIBAN HCL IN NACL 5-0.9 MG/100ML-% IV SOLN
INTRAVENOUS | Status: AC
  Filled 2023-03-04: qty 100

## 2023-03-04 MED ORDER — NITROGLYCERIN IN D5W 200-5 MCG/ML-% IV SOLN
INTRAVENOUS | Status: DC | PRN
  Administered 2023-03-04: 10 ug/min via INTRAVENOUS

## 2023-03-04 MED ORDER — METOPROLOL TARTRATE 5 MG/5ML IV SOLN
INTRAVENOUS | Status: DC | PRN
  Administered 2023-03-04: 10 mg via INTRAVENOUS

## 2023-03-04 MED ORDER — HEPARIN SODIUM (PORCINE) 1000 UNIT/ML IJ SOLN
INTRAMUSCULAR | Status: AC
  Filled 2023-03-04: qty 10

## 2023-03-04 MED ORDER — HEPARIN SODIUM (PORCINE) 5000 UNIT/ML IJ SOLN: 5000.0000 [IU] | Freq: Once | INTRAMUSCULAR | Status: DC

## 2023-03-04 MED ORDER — IOHEXOL 300 MG/ML  SOLN
INTRAMUSCULAR | Status: DC | PRN
  Administered 2023-03-04: 313 mL

## 2023-03-04 MED ORDER — FUROSEMIDE 10 MG/ML IJ SOLN
INTRAMUSCULAR | Status: DC | PRN
  Administered 2023-03-04: 40 mg via INTRAVENOUS

## 2023-03-04 MED ORDER — LIDOCAINE HCL 1 % IJ SOLN
INTRAMUSCULAR | Status: AC
  Filled 2023-03-04: qty 20

## 2023-03-04 MED ORDER — FENTANYL CITRATE (PF) 100 MCG/2ML IJ SOLN
INTRAMUSCULAR | Status: AC
  Filled 2023-03-04: qty 2

## 2023-03-04 MED ORDER — SODIUM CHLORIDE 0.9 % IV SOLN: INTRAVENOUS | Status: DC

## 2023-03-04 MED ORDER — MIDAZOLAM HCL 2 MG/2ML IJ SOLN
INTRAMUSCULAR | Status: AC
  Filled 2023-03-04: qty 2

## 2023-03-04 MED ORDER — LIDOCAINE HCL (PF) 1 % IJ SOLN
INTRAMUSCULAR | Status: DC | PRN
  Administered 2023-03-04: 2 mL

## 2023-03-04 MED ORDER — HEPARIN (PORCINE) IN NACL 1000-0.9 UT/500ML-% IV SOLN
INTRAVENOUS | Status: AC
  Filled 2023-03-04: qty 1000

## 2023-03-04 MED ORDER — HEPARIN (PORCINE) IN NACL 1000-0.9 UT/500ML-% IV SOLN
INTRAVENOUS | Status: AC
  Filled 2023-03-04: qty 500

## 2023-03-04 MED ORDER — VERAPAMIL HCL 2.5 MG/ML IV SOLN
INTRAVENOUS | Status: AC
  Filled 2023-03-04: qty 2

## 2023-03-04 MED ORDER — HEPARIN SODIUM (PORCINE) 5000 UNIT/ML IJ SOLN
6000.0000 [IU] | Freq: Once | INTRAMUSCULAR | Status: AC
  Administered 2023-03-04: 6000 [IU] via INTRAVENOUS
  Filled 2023-03-04: qty 2

## 2023-03-04 MED ORDER — SODIUM CHLORIDE 0.9 % WEIGHT BASED INFUSION
1.0000 mL/kg/h | INTRAVENOUS | Status: AC
Start: 2023-03-05 — End: 2023-03-05
  Administered 2023-03-05 (×2): 1 mL/kg/h via INTRAVENOUS

## 2023-03-04 MED ORDER — METOPROLOL TARTRATE 5 MG/5ML IV SOLN
INTRAVENOUS | Status: AC
  Filled 2023-03-04: qty 5

## 2023-03-04 MED ORDER — ADENOSINE 6 MG/2ML IV SOLN
INTRAVENOUS | Status: AC
  Filled 2023-03-04: qty 2

## 2023-03-04 MED ORDER — TICAGRELOR 90 MG PO TABS: 90.0000 mg | ORAL_TABLET | Freq: Two times a day (BID) | ORAL | Status: DC

## 2023-03-04 MED ORDER — NITROGLYCERIN IN D5W 200-5 MCG/ML-% IV SOLN
INTRAVENOUS | Status: AC
  Filled 2023-03-04: qty 250

## 2023-03-04 MED ORDER — NITROGLYCERIN 1 MG/10 ML FOR IR/CATH LAB
INTRA_ARTERIAL | Status: DC | PRN
  Administered 2023-03-04: 200 ug

## 2023-03-04 MED ORDER — TICAGRELOR 90 MG PO TABS: 180.0000 mg | ORAL_TABLET | Freq: Once | ORAL | Status: DC

## 2023-03-04 MED ORDER — HEPARIN (PORCINE) IN NACL 1000-0.9 UT/500ML-% IV SOLN
INTRAVENOUS | Status: DC | PRN
  Administered 2023-03-04 (×2): 500 mL

## 2023-03-04 MED ORDER — ASPIRIN 81 MG PO CHEW: 81.0000 mg | CHEWABLE_TABLET | Freq: Every day | ORAL | Status: DC

## 2023-03-04 MED ORDER — TIROFIBAN (AGGRASTAT) BOLUS VIA INFUSION
INTRAVENOUS | Status: DC | PRN
  Administered 2023-03-04: 3630 ug via INTRAVENOUS

## 2023-03-04 MED ORDER — TIROFIBAN HCL IN NACL 5-0.9 MG/100ML-% IV SOLN: 0.1500 ug/kg/min | INTRAVENOUS | Status: DC

## 2023-03-04 MED ORDER — FENTANYL CITRATE (PF) 100 MCG/2ML IJ SOLN
INTRAMUSCULAR | Status: DC | PRN
  Administered 2023-03-04: 50 ug via INTRAVENOUS
  Administered 2023-03-04 (×2): 25 ug via INTRAVENOUS
  Administered 2023-03-04 (×2): 50 ug via INTRAVENOUS

## 2023-03-04 MED ORDER — NITROGLYCERIN IN D5W 200-5 MCG/ML-% IV SOLN: 0.0000 ug/min | INTRAVENOUS | Status: DC

## 2023-03-04 MED ORDER — TIROFIBAN HCL IV 12.5 MG/250 ML
INTRAVENOUS | Status: AC
  Filled 2023-03-04: qty 250

## 2023-03-04 MED ORDER — HEPARIN SODIUM (PORCINE) 5000 UNIT/ML IJ SOLN
4000.0000 [IU] | Freq: Once | INTRAMUSCULAR | Status: DC
  Filled 2023-03-04: qty 1

## 2023-03-04 MED ORDER — VERAPAMIL HCL 2.5 MG/ML IV SOLN
INTRAVENOUS | Status: DC | PRN
  Administered 2023-03-04: 2.5 mg via INTRAVENOUS

## 2023-03-04 MED ORDER — ADENOSINE (DIAGNOSTIC) FOR INTRACORONARY USE
INTRAVENOUS | Status: DC | PRN
  Administered 2023-03-04: 96 ug via INTRACORONARY
  Administered 2023-03-04: 72 ug via INTRACORONARY

## 2023-03-04 MED ORDER — ACETAMINOPHEN 325 MG PO TABS
650.0000 mg | ORAL_TABLET | ORAL | Status: DC | PRN
Start: 2023-03-04 — End: 2023-03-06
  Administered 2023-03-05: 650 mg via ORAL
  Filled 2023-03-04: qty 2

## 2023-03-04 MED ORDER — TIROFIBAN HCL IV 12.5 MG/250 ML
INTRAVENOUS | Status: DC | PRN
  Administered 2023-03-04: .075 ug/kg/min via INTRAVENOUS

## 2023-03-04 MED ORDER — ONDANSETRON HCL 4 MG/2ML IJ SOLN
4.0000 mg | Freq: Four times a day (QID) | INTRAMUSCULAR | Status: DC | PRN
Start: 2023-03-04 — End: 2023-03-06

## 2023-03-04 MED ORDER — FUROSEMIDE 10 MG/ML IJ SOLN
INTRAMUSCULAR | Status: AC
  Filled 2023-03-04: qty 4

## 2023-03-04 MED ORDER — ASPIRIN 81 MG PO CHEW
324.0000 mg | CHEWABLE_TABLET | Freq: Once | ORAL | Status: AC
Start: 2023-03-04 — End: 2023-03-04
  Administered 2023-03-04: 243 mg via ORAL
  Filled 2023-03-04: qty 4

## 2023-03-04 MED ORDER — ORAL CARE MOUTH RINSE: 15.0000 mL | OROMUCOSAL | Status: DC | PRN

## 2023-03-04 MED ORDER — CHLORHEXIDINE GLUCONATE CLOTH 2 % EX PADS
6.0000 | MEDICATED_PAD | Freq: Every day | CUTANEOUS | Status: DC
Start: 2023-03-05 — End: 2023-03-06
  Administered 2023-03-05 – 2023-03-06 (×2): 6 via TOPICAL

## 2023-03-04 MED ORDER — METOPROLOL TARTRATE 5 MG/5ML IV SOLN
5.0000 mg | Freq: Once | INTRAVENOUS | Status: AC
  Administered 2023-03-04: 5 mg via INTRAVENOUS
  Filled 2023-03-04: qty 5

## 2023-03-04 SURGICAL SUPPLY — 32 items
BALLN IABP SENSA PLUS 8F 50CC (BALLOONS) ×1
BALLN TREK RX 2.5X15 (BALLOONS) ×1
BALLN TREK RX 3.0X15 (BALLOONS) ×1
BALLN ~~LOC~~ TREK NEO RX 2.75X15 (BALLOONS) ×1
BALLOON IABP SENS PLUS 8F 50CC (BALLOONS) IMPLANT
BALLOON TREK RX 2.5X15 (BALLOONS) IMPLANT
BALLOON TREK RX 3.0X15 (BALLOONS) IMPLANT
BALLOON ~~LOC~~ TREK NEO RX 2.75X15 (BALLOONS) IMPLANT
CANNULA 5F STIFF (CANNULA) IMPLANT
CATH 5FR JL3.5 JR4 ANG PIG MP (CATHETERS) IMPLANT
CATH SWAN GANZ 7F STRAIGHT (CATHETERS) IMPLANT
CATH VISTA GUIDE 6FR XB3.5 (CATHETERS) IMPLANT
DEVICE RAD COMP TR BAND LRG (VASCULAR PRODUCTS) IMPLANT
DRAPE BRACHIAL (DRAPES) IMPLANT
GLIDESHEATH SLEND SS 6F .021 (SHEATH) IMPLANT
GUIDEWIRE INQWIRE 1.5J.035X260 (WIRE) IMPLANT
INQWIRE 1.5J .035X260CM (WIRE) ×1
KIT ENCORE 26 ADVANTAGE (KITS) IMPLANT
KIT MICROPUNCTURE NIT STIFF (SHEATH) IMPLANT
KIT SYRINGE INJ CVI SPIKEX1 (MISCELLANEOUS) IMPLANT
PACK CARDIAC CATH (CUSTOM PROCEDURE TRAY) ×1 IMPLANT
PROTECTION STATION PRESSURIZED (MISCELLANEOUS) ×1
SET ATX-X65L (MISCELLANEOUS) IMPLANT
SHEATH AVANTI 6FR X 11CM (SHEATH) IMPLANT
SHEATH AVANTI 7FRX11 (SHEATH) IMPLANT
SHEATH BRITE TIP 8FRX11 (SHEATH) IMPLANT
STATION PROTECTION PRESSURIZED (MISCELLANEOUS) IMPLANT
STENT ONYX FRONTIER 2.5X34 (Permanent Stent) IMPLANT
STENT ONYX FRONTIER 3.0X30 (Permanent Stent) IMPLANT
TUBING CIL FLEX 10 FLL-RA (TUBING) IMPLANT
WIRE G HI TQ BMW 190 (WIRE) IMPLANT
WIRE GUIDERIGHT .035X150 (WIRE) IMPLANT

## 2023-03-04 NOTE — ED Notes (Signed)
 Pts clothing removed and placed inside personal belongings bag and given to spouse at bedside. Pts gray bracelet and gray ring removed and also given to spouse at bedside.

## 2023-03-04 NOTE — ED Notes (Addendum)
 CARDIOLOGIST, [facility]WO[place] AT BEDSIDE @ THIS TIME.

## 2023-03-04 NOTE — ED Provider Notes (Addendum)
 [facility] Provider Note    Event Date/Time   First MD Initiated Contact with Patient 03/04/23 1903     (approximate)   History   Chest Pain   HPI  Bradley Scott is a 48 y.o. male with a history of hypertension who presents with chest pain, acute onset yesterday evening, described as a heartburn type sensation.  It worsened this evening and started spreading to both arms.  He denies any associated shortness of breath or lightheadedness.  He denies any leg swelling.  He has no cough or fever.  I reviewed the past medical records.  The patient's most recent outpatient encounter was in evaluation by orthopedics on 1/11 of last year for carpal tunnel syndrome and radiculopathy.  He has no recent hospitalizations or ED visits.   Physical Exam   Triage Vital Signs: ED Triage Vitals  Encounter Vitals Group     BP 03/04/23 1907 (!) 158/114     Systolic BP Percentile --      Diastolic BP Percentile --      Pulse Rate 03/04/23 1907 (!) 112     Resp 03/04/23 1907 (!) 22     Temp 03/04/23 1907 98.4 F (36.9 C)     Temp Source 03/04/23 1907 Oral     SpO2 03/04/23 1907 100 %     Weight 03/04/23 1857 (!) 320 lb (145.2 kg)     Height 03/04/23 1857 5' 10 (1.778 m)     Head Circumference --      Peak Flow --      Pain Score 03/04/23 1857 10     Pain Loc --      Pain Education --      Exclude from Growth Chart --     Most recent vital signs: Vitals:   03/04/23 1907 03/04/23 1930  BP: (!) 158/114 (!) 142/108  Pulse: (!) 112 95  Resp: (!) 22 (!) 21  Temp: 98.4 F (36.9 C)   SpO2: 100% 100%     General: Awake, no distress.  CV:  Good peripheral perfusion.  Resp:  Normal effort.  Abd:  No distention.  Other:  No peripheral edema.   ED Results / Procedures / Treatments   Labs (all labs ordered are listed, but only abnormal results are displayed) Labs Reviewed  APTT - Abnormal; Notable for the following components:      Result Value   aPTT 38  (*)    All other components within normal limits  CBC WITH DIFFERENTIAL/PLATELET  PROTIME-INR  HEMOGLOBIN A1C  COMPREHENSIVE METABOLIC PANEL  LIPID PANEL  I-STAT CG4 LACTIC ACID, ED  TROPONIN I (HIGH SENSITIVITY)     EKG  ED ECG REPORT I, Waylon Cassis, the attending physician, personally viewed and interpreted this ECG.  Date: 03/04/2023 EKG Time: 1858 Rate: 111 Rhythm: normal sinus rhythm QRS Axis: normal Intervals: normal ST/T Wave abnormalities: Anterior ST elevations Narrative Interpretation: STEMI    RADIOLOGY    PROCEDURES:  Critical Care performed: Yes, see critical care procedure note(s)  .Critical Care  Performed by: Cassis Waylon, MD Authorized by: Cassis Waylon, MD   Critical care provider statement:    Critical care time (minutes):  30   Critical care time was exclusive of:  Separately billable procedures and treating other patients   Critical care was necessary to treat or prevent imminent or life-threatening deterioration of the following conditions:  Cardiac failure   Critical care was time spent personally by me on  the following activities:  Development of treatment plan with patient or surrogate, discussions with consultants, evaluation of patient's response to treatment, examination of patient, ordering and review of laboratory studies, ordering and review of radiographic studies, ordering and performing treatments and interventions, pulse oximetry, re-evaluation of patient's condition, review of old charts and obtaining history from patient or surrogate   Care discussed with: admitting provider      MEDICATIONS ORDERED IN ED: Medications  0.9 %  sodium chloride  infusion (has no administration in time range)  ticagrelor  (BRILINTA ) tablet 180 mg ( Oral MAR Hold 03/04/23 1939)  aspirin  chewable tablet 324 mg (243 mg Oral Given 03/04/23 1918)  heparin  injection 6,000 Units (6,000 Units Intravenous Given 03/04/23 1916)  metoprolol   tartrate (LOPRESSOR ) injection 5 mg (5 mg Intravenous Given 03/04/23 1915)     IMPRESSION / MDM / ASSESSMENT AND PLAN / ED COURSE  I reviewed the triage vital signs and the nursing notes.  48 year old male with PMH as noted above presents with chest pain since yesterday evening.  The patient is hypertensive and borderline tachycardic.  Other vital signs are normal.  He is overall relatively well-appearing.  EKG in triage showed findings consistent with STEMI.  I immediately activated code STEMI and had the patient brought back to a room.  Differential diagnosis includes, but is not limited to, ST elevation MI.  Patient's presentation is most consistent with acute presentation with potential threat to life or bodily function.  The patient is on the cardiac monitor to evaluate for evidence of arrhythmia and/or significant heart rate changes.  Code STEMI was activated immediately on my review of the EKG.  The patient was given aspirin , Brilinta , and heparin .  I consulted and discussed the case with Dr. Florencio who evaluated the patient in person shortly after the patient's arrival.  He requested metoprolol  to improve the patient's heart rate.  He then took the patient to the Cath Lab.    FINAL CLINICAL IMPRESSION(S) / ED DIAGNOSES   Final diagnoses:  ST elevation myocardial infarction (STEMI), unspecified artery (HCC)     Rx / DC Orders   ED Discharge Orders     None        Note:  This document was prepared using Dragon voice recognition software and may include unintentional dictation errors.    Jacolyn Pae, MD 03/04/23 1944    Jacolyn Pae, MD 03/04/23 1945

## 2023-03-04 NOTE — Consult Note (Signed)
 CARDIOLOGY CONSULT NOTE               Patient ID: Bradley Scott MRN: [id number] DOB/AGE: [birth year] 48 y.o.  Admit date: 03/04/2023 Referring Physician Dr. Waylon Cassis emergency room Primary Physician  Primary Cardiologist  Reason for Consultation anterior STEMI  HPI: 48 year old morbidly obese white male smoker hypertension no primary physician no medical contact for quite some time reportedly started having chest pain yesterday he thought was indigestion finally came in via POV with abnormal EKG with ST elevation anteriorly patient states the pain is is 9 out of 10 he is hemodynamically stable systolic blood pressure of 150 heart rate of 110.  No nausea vomiting no diarrhea no sweating.  No radiation of the pain is mostly midsternal.  Review of systems complete and found to be negative unless listed above     Past Medical History:  Diagnosis Date   ADHD     Past Surgical History:  Procedure Laterality Date   WISDOM TOOTH EXTRACTION      (Not in a hospital admission)  Social History   Socioeconomic History   Marital status: Married    Spouse name: Bradley Scott   Number of children: 2   Years of education: 12   Highest education level: High school graduate  Occupational History   Occupation: nature conservation officer    Employer: [employer]  Tobacco Use   Smoking status: Every Day    Current packs/day: 1.00    Average packs/day: 1 pack/day for 10.0 years (10.0 ttl pk-yrs)    Types: Cigarettes   Smokeless tobacco: Never   Tobacco comments:    started smoking in 2009.  Vaping Use   Vaping status: Never Used  Substance and Sexual Activity   Alcohol use: Yes    Comment: very occasional, socially   Drug use: Never   Sexual activity: Yes    Partners: Female  Other Topics Concern   Not on file  Social History Narrative   Not on file   Social Drivers of Health   Financial Resource Strain: Not on file  Food Insecurity: Not on file  Transportation  Needs: Not on file  Physical Activity: Not on file  Stress: Not on file  Social Connections: Not on file  Intimate Partner Violence: Not on file    Family History  Problem Relation Age of Onset   Breast cancer Mother 28   Obesity Father    ADD / ADHD Son    Colon cancer Neg Hx    Prostate cancer Neg Hx       Review of systems complete and found to be negative unless listed above      PHYSICAL EXAM  General: Well developed, well nourished, in no acute distress HEENT:  Normocephalic and atramatic Neck:  No JVD.  Lungs: Clear bilaterally to auscultation and percussion. Heart: HRRR . Normal S1 and S2 without gallops or murmurs.  Abdomen: Bowel sounds are positive, abdomen soft and non-tender  Msk:  Back normal, normal gait. Normal strength and tone for age. Extremities: No clubbing, cyanosis or edema.   Neuro: Alert and oriented X 3. Psych:  Good affect, responds appropriately  Labs:  No results found for: WBC, HGB, HCT, MCV, PLT No results for input(s): NA, K, CL, CO2, BUN, CREATININE, CALCIUM , PROT, BILITOT, ALKPHOS, ALT, AST, GLUCOSE in the last 168 hours.  Invalid input(s): LABALBU No results found for: CKTOTAL, CKMB, CKMBINDEX, TROPONINI No results found for: CHOL No results found for: HDL  No results found for: LDLCALC No results found for: TRIG No results found for: CHOLHDL No results found for: LDLDIRECT    Radiology: No results found.  EKG: Sinus tachycardia diffuse ST elevation anteriorly  ASSESSMENT AND PLAN:  Anterior STEMI Hypertension Obesity Smoking Probable obstructive sleep apnea  . Plan Instructed the ER to give heparin  full dose aspirin  Also because he is tachycardic recommended 5 mg of IV metoprolol  Patient is already gotten Brilinta  180 mg Heparin  bolus of 6000 was given Recommend taking the patient directly to the Cath Lab with intention to treat for possible anterior STEMI  hopefully will be able to place a stent to relieve the occlusion Advised patient refrain from tobacco abuse Recommend weight loss exercise portion control Patient will probably need a sleep study at some point in the future  Signed: Cara JONETTA Lovelace MD 03/04/2023, 7:21 PM

## 2023-03-04 NOTE — ED Triage Notes (Signed)
 Pt c/o acute onset of mid-sternal CP while walking in [employer] that radiates to both arms and his jaw. Wife reports pt was c/o heartburn this morning but did not take any medication for it. Pt denies any cardiac hx. Pt endorses difficulty breathing with the CP.

## 2023-03-04 NOTE — Progress Notes (Signed)
   03/04/23 1905  Spiritual Encounters  Type of Visit Initial  Care provided to: Pt and family  Referral source Code page  Reason for visit Code  OnCall Visit Yes  Interventions  Spiritual Care Interventions Made Prayer   Chaplain responded to Code STEMI and provided support to patient, wife, and family.

## 2023-03-04 NOTE — ED Notes (Signed)
 Activated Code Stemi w/ [facility], per Dr. Marisa Severin

## 2023-03-04 NOTE — ED Notes (Signed)
 Pt going to Cath Lab at this time.

## 2023-03-05 ENCOUNTER — Inpatient Hospital Stay (HOSPITAL_COMMUNITY): Payer: BC Managed Care – PPO

## 2023-03-05 ENCOUNTER — Other Ambulatory Visit: Payer: Self-pay

## 2023-03-05 ENCOUNTER — Encounter (HOSPITAL_COMMUNITY)
Admission: EM | Disposition: A | Discharge status: Home / Self Care | Payer: Self-pay | Source: Other Acute Inpatient Hospital | Attending: Cardiology

## 2023-03-05 ENCOUNTER — Inpatient Hospital Stay (HOSPITAL_COMMUNITY): Payer: BC Managed Care – PPO | Admitting: Anesthesiology

## 2023-03-05 ENCOUNTER — Encounter: Payer: Self-pay | Admitting: Internal Medicine

## 2023-03-05 DIAGNOSIS — I729 Aneurysm of unspecified site: Secondary | ICD-10-CM

## 2023-03-05 DIAGNOSIS — I724 Aneurysm of artery of lower extremity: Secondary | ICD-10-CM

## 2023-03-05 DIAGNOSIS — R578 Other shock: Secondary | ICD-10-CM | POA: Diagnosis not present

## 2023-03-05 DIAGNOSIS — T81718A Complication of other artery following a procedure, not elsewhere classified, initial encounter: Secondary | ICD-10-CM

## 2023-03-05 DIAGNOSIS — M79604 Pain in right leg: Secondary | ICD-10-CM | POA: Diagnosis not present

## 2023-03-05 DIAGNOSIS — I2102 ST elevation (STEMI) myocardial infarction involving left anterior descending coronary artery: Secondary | ICD-10-CM | POA: Diagnosis not present

## 2023-03-05 DIAGNOSIS — I5020 Unspecified systolic (congestive) heart failure: Secondary | ICD-10-CM | POA: Diagnosis not present

## 2023-03-05 HISTORY — PX: LOWER EXTREMITY ANGIOGRAM: SHX5508

## 2023-03-05 HISTORY — PX: ENDOVASCULAR STENT INSERTION: SHX5161

## 2023-03-05 LAB — HEMOGLOBIN A1C
Hgb A1c MFr Bld: 6.5 % — ABNORMAL HIGH (ref 4.8–5.6)
Mean Plasma Glucose: 140 mg/dL

## 2023-03-05 LAB — BASIC METABOLIC PANEL
Anion gap: 10 (ref 5–15)
Anion gap: 8 (ref 5–15)
BUN: 18 mg/dL (ref 6–20)
BUN: 17 mg/dL (ref 6–20)
CO2: 22 mmol/L (ref 22–32)
CO2: 24 mmol/L (ref 22–32)
Calcium: 8.5 mg/dL — ABNORMAL LOW (ref 8.9–10.3)
Calcium: 8.4 mg/dL — ABNORMAL LOW (ref 8.9–10.3)
Chloride: 100 mmol/L (ref 98–111)
Chloride: 104 mmol/L (ref 98–111)
Creatinine, Ser: 1.15 mg/dL (ref 0.61–1.24)
Creatinine, Ser: 1.11 mg/dL (ref 0.61–1.24)
GFR, Estimated: >60 mL/min (ref >60)
GFR, Estimated: >60 mL/min (ref >60)
Glucose, Bld: 166 mg/dL — ABNORMAL HIGH (ref 70–99)
Glucose, Bld: 143 mg/dL — ABNORMAL HIGH (ref 70–99)
Potassium: 4.4 mmol/L (ref 3.5–5.1)
Potassium: 4 mmol/L (ref 3.5–5.1)
Sodium: 132 mmol/L — ABNORMAL LOW (ref 135–145)
Sodium: 136 mmol/L (ref 135–145)

## 2023-03-05 LAB — ABO/RH: ABO/RH(D): A POS

## 2023-03-05 LAB — TYPE AND SCREEN
ABO/RH(D): A POS
Antibody Screen: NEGATIVE

## 2023-03-05 LAB — CARDIAC CATHETERIZATION: Cath EF Quantitative: 35 %

## 2023-03-05 LAB — CBC
HCT: 40.4 % (ref 39.0–52.0)
HCT: 43 % (ref 39.0–52.0)
Hemoglobin: 13.4 g/dL (ref 13.0–17.0)
Hemoglobin: 14.6 g/dL (ref 13.0–17.0)
MCH: 30.9 pg (ref 26.0–34.0)
MCH: 30.9 pg (ref 26.0–34.0)
MCHC: 33.2 g/dL (ref 30.0–36.0)
MCHC: 34 g/dL (ref 30.0–36.0)
MCV: 90.9 fL (ref 80.0–100.0)
MCV: 93.1 fL (ref 80.0–100.0)
Platelets: 171 10*3/uL (ref 150–400)
Platelets: 188 10*3/uL (ref 150–400)
RBC: 4.34 MIL/uL (ref 4.22–5.81)
RBC: 4.73 MIL/uL (ref 4.22–5.81)
RDW: 13.5 % (ref 11.5–15.5)
RDW: 13.5 % (ref 11.5–15.5)
WBC: 11 10*3/uL — ABNORMAL HIGH (ref 4.0–10.5)
WBC: 14.4 10*3/uL — ABNORMAL HIGH (ref 4.0–10.5)
nRBC: 0 % (ref 0.0–0.2)
nRBC: 0 % (ref 0.0–0.2)

## 2023-03-05 LAB — COOXEMETRY PANEL
Carboxyhemoglobin: 4.4 % — ABNORMAL HIGH (ref 0.5–1.5)
Methemoglobin: 0.8 % (ref 0.0–1.5)
O2 Saturation: 64.9 %
Total hemoglobin: 14.8 g/dL (ref 12.0–16.0)

## 2023-03-05 LAB — POCT ACTIVATED CLOTTING TIME: Activated Clotting Time: 331 s

## 2023-03-05 LAB — ECHOCARDIOGRAM COMPLETE
Area-P 1/2: 2.91 cm2
Height: 70 in
MV VTI: 2.38 cm2
S' Lateral: 2.5 cm
Weight: 5120 [oz_av]

## 2023-03-05 LAB — MRSA NEXT GEN BY PCR, NASAL: MRSA by PCR Next Gen: NOT DETECTED

## 2023-03-05 LAB — LACTIC ACID, PLASMA: Lactic Acid, Venous: 0.7 mmol/L (ref 0.5–1.9)

## 2023-03-05 LAB — CG4 I-STAT (LACTIC ACID): Lactic Acid, Venous: 0.3 mmol/L — ABNORMAL LOW (ref 0.5–1.9)

## 2023-03-05 SURGERY — ANGIOGRAM, LOWER EXTREMITY
Anesthesia: Monitor Anesthesia Care | Site: Groin | Laterality: Right

## 2023-03-05 MED ORDER — CHLORHEXIDINE GLUCONATE 0.12 % MT SOLN: 15.0000 mL | Freq: Once | OROMUCOSAL | Status: AC

## 2023-03-05 MED ORDER — ASPIRIN 81 MG PO CHEW
81.0000 mg | CHEWABLE_TABLET | Freq: Every day | ORAL | Status: DC
  Administered 2023-03-06: 81 mg via ORAL
  Filled 2023-03-05: qty 1

## 2023-03-05 MED ORDER — HYDROMORPHONE HCL 1 MG/ML IJ SOLN
1.0000 mg | INTRAMUSCULAR | Status: DC | PRN
  Administered 2023-03-05 (×3): 1 mg via INTRAVENOUS
  Filled 2023-03-05 (×3): qty 1

## 2023-03-05 MED ORDER — HYDROMORPHONE HCL 1 MG/ML IJ SOLN
1.0000 mg | Freq: Once | INTRAMUSCULAR | Status: AC
  Administered 2023-03-05: 1 mg via INTRAVENOUS

## 2023-03-05 MED ORDER — HYDROMORPHONE HCL 1 MG/ML IJ SOLN
INTRAMUSCULAR | Status: AC
  Filled 2023-03-05: qty 1

## 2023-03-05 MED ORDER — HEPARIN 6000 UNIT IRRIGATION SOLUTION
Status: DC | PRN
  Administered 2023-03-05: 1

## 2023-03-05 MED ORDER — METOPROLOL TARTRATE 5 MG/5ML IV SOLN
INTRAVENOUS | Status: DC | PRN
  Administered 2023-03-05 (×2): 2 mg via INTRAVENOUS

## 2023-03-05 MED ORDER — MIDAZOLAM HCL 2 MG/2ML IJ SOLN
INTRAMUSCULAR | Status: DC | PRN
  Administered 2023-03-05 (×2): 1 mg via INTRAVENOUS

## 2023-03-05 MED ORDER — PROPOFOL 10 MG/ML IV BOLUS
INTRAVENOUS | Status: AC
  Filled 2023-03-05: qty 20

## 2023-03-05 MED ORDER — ACETAMINOPHEN 10 MG/ML IV SOLN: 1000.0000 mg | Freq: Once | INTRAVENOUS | Status: DC | PRN

## 2023-03-05 MED ORDER — IOHEXOL 350 MG/ML SOLN
100.0000 mL | Freq: Once | INTRAVENOUS | Status: AC | PRN
  Administered 2023-03-05: 100 mL via INTRAVENOUS

## 2023-03-05 MED ORDER — PROTAMINE SULFATE 10 MG/ML IV SOLN
INTRAVENOUS | Status: DC | PRN
  Administered 2023-03-05: 100 mg via INTRAVENOUS

## 2023-03-05 MED ORDER — FENTANYL CITRATE (PF) 100 MCG/2ML IJ SOLN: 25.0000 ug | INTRAMUSCULAR | Status: DC | PRN

## 2023-03-05 MED ORDER — LIDOCAINE-EPINEPHRINE (PF) 1 %-1:200000 IJ SOLN
INTRAMUSCULAR | Status: AC
  Filled 2023-03-05: qty 30

## 2023-03-05 MED ORDER — ONDANSETRON HCL 4 MG/2ML IJ SOLN: 4.0000 mg | Freq: Once | INTRAMUSCULAR | Status: DC | PRN

## 2023-03-05 MED ORDER — CEFAZOLIN SODIUM 1 G IJ SOLR
INTRAMUSCULAR | Status: AC
  Filled 2023-03-05: qty 40

## 2023-03-05 MED ORDER — SODIUM CHLORIDE 0.9 % IV SOLN: INTRAVENOUS | Status: AC

## 2023-03-05 MED ORDER — FENTANYL CITRATE PF 50 MCG/ML IJ SOSY
12.5000 ug | PREFILLED_SYRINGE | Freq: Once | INTRAMUSCULAR | Status: AC
  Administered 2023-03-05: 12.5 ug via INTRAVENOUS
  Filled 2023-03-05: qty 1

## 2023-03-05 MED ORDER — FENTANYL CITRATE PF 50 MCG/ML IJ SOSY: 25.0000 ug | PREFILLED_SYRINGE | Freq: Once | INTRAMUSCULAR | Status: AC

## 2023-03-05 MED ORDER — FENTANYL CITRATE (PF) 100 MCG/2ML IJ SOLN
INTRAMUSCULAR | Status: DC | PRN
  Administered 2023-03-05 (×2): 25 ug via INTRAVENOUS
  Administered 2023-03-05: 50 ug via INTRAVENOUS

## 2023-03-05 MED ORDER — TICAGRELOR 90 MG PO TABS
90.0000 mg | ORAL_TABLET | Freq: Two times a day (BID) | ORAL | Status: DC
  Administered 2023-03-05 – 2023-03-06 (×3): 90 mg via ORAL
  Filled 2023-03-05 (×3): qty 1

## 2023-03-05 MED ORDER — FENTANYL CITRATE PF 50 MCG/ML IJ SOSY
PREFILLED_SYRINGE | INTRAMUSCULAR | Status: AC
  Administered 2023-03-05: 25 ug via INTRAVENOUS
  Filled 2023-03-05: qty 1

## 2023-03-05 MED ORDER — CHLORHEXIDINE GLUCONATE 0.12 % MT SOLN
OROMUCOSAL | Status: AC
  Administered 2023-03-05: 15 mL via OROMUCOSAL
  Filled 2023-03-05: qty 15

## 2023-03-05 MED ORDER — STERILE WATER FOR IRRIGATION IR SOLN
Status: DC | PRN
  Administered 2023-03-05: 1000 mL

## 2023-03-05 MED ORDER — LACTATED RINGERS IV SOLN: INTRAVENOUS | Status: DC

## 2023-03-05 MED ORDER — ROSUVASTATIN CALCIUM 20 MG PO TABS
20.0000 mg | ORAL_TABLET | Freq: Every day | ORAL | Status: DC
Start: 2023-03-05 — End: 2023-03-06
  Administered 2023-03-05 – 2023-03-06 (×2): 20 mg via ORAL
  Filled 2023-03-05 (×2): qty 1

## 2023-03-05 MED ORDER — HEPARIN 6000 UNIT IRRIGATION SOLUTION
Status: AC
  Filled 2023-03-05: qty 500

## 2023-03-05 MED ORDER — MIDAZOLAM HCL 2 MG/2ML IJ SOLN
INTRAMUSCULAR | Status: AC
  Filled 2023-03-05: qty 2

## 2023-03-05 MED ORDER — PERFLUTREN LIPID MICROSPHERE
1.0000 mL | INTRAVENOUS | Status: AC | PRN
  Administered 2023-03-05: 6 mL via INTRAVENOUS

## 2023-03-05 MED ORDER — LACTATED RINGERS IV SOLN: INTRAVENOUS | Status: DC | PRN

## 2023-03-05 MED ORDER — HEPARIN SODIUM (PORCINE) 1000 UNIT/ML IJ SOLN
INTRAMUSCULAR | Status: DC | PRN
  Administered 2023-03-05: 12000 [IU] via INTRAVENOUS

## 2023-03-05 MED ORDER — HEPARIN SODIUM (PORCINE) 1000 UNIT/ML IJ SOLN
INTRAMUSCULAR | Status: AC
  Filled 2023-03-05: qty 20

## 2023-03-05 MED ORDER — DEXTROSE 5 % IV SOLN
INTRAVENOUS | Status: DC | PRN
  Administered 2023-03-05: 3 g via INTRAVENOUS

## 2023-03-05 MED ORDER — ORAL CARE MOUTH RINSE: 15.0000 mL | Freq: Once | OROMUCOSAL | Status: AC

## 2023-03-05 MED ORDER — FENTANYL CITRATE (PF) 250 MCG/5ML IJ SOLN
INTRAMUSCULAR | Status: AC
  Filled 2023-03-05: qty 5

## 2023-03-05 MED ORDER — LIDOCAINE-EPINEPHRINE (PF) 1 %-1:200000 IJ SOLN
INTRAMUSCULAR | Status: DC | PRN
  Administered 2023-03-05: 4 mL

## 2023-03-05 MED ORDER — IODIXANOL 320 MG/ML IV SOLN
INTRAVENOUS | Status: DC | PRN
  Administered 2023-03-05: 50 mL via INTRA_ARTERIAL

## 2023-03-05 SURGICAL SUPPLY — 46 items
BAG COUNTER SPONGE SURGICOUNT (BAG) ×1 IMPLANT
BAG SNAP BAND KOVER 36X36 (MISCELLANEOUS) ×1 IMPLANT
BLADE SURG 11 STRL SS (BLADE) ×1 IMPLANT
CANISTER SUCT 3000ML PPV (MISCELLANEOUS) ×1 IMPLANT
CATH BEACON 5 .035 65 KMP TIP (CATHETERS) IMPLANT
CATH OMNI FLUSH 5F 65CM (CATHETERS) IMPLANT
CHLORAPREP W/TINT 26 (MISCELLANEOUS) ×1 IMPLANT
CLIP LIGATING EXTRA MED SLVR (CLIP) ×1 IMPLANT
CLIP LIGATING EXTRA SM BLUE (MISCELLANEOUS) ×1 IMPLANT
CLOSURE PERCLOSE PROSTYLE (VASCULAR PRODUCTS) IMPLANT
COVER DOME SNAP 22 D (MISCELLANEOUS) ×1 IMPLANT
COVER PROBE W GEL 5X96 (DRAPES) ×1 IMPLANT
COVER SURGICAL LIGHT HANDLE (MISCELLANEOUS) ×1 IMPLANT
DERMABOND ADVANCED .7 DNX12 (GAUZE/BANDAGES/DRESSINGS) ×2 IMPLANT
DRAPE FEMORAL ANGIO 80X135IN (DRAPES) ×1 IMPLANT
GAUZE 4X4 16PLY ~~LOC~~+RFID DBL (SPONGE) ×1 IMPLANT
GLIDEWIRE ADV .035X180CM (WIRE) IMPLANT
GLIDEWIRE ADV .035X260CM (WIRE) IMPLANT
GLOVE BIO SURGEON STRL SZ7.5 (GLOVE) ×1 IMPLANT
GOWN STRL REUS W/ TWL LRG LVL3 (GOWN DISPOSABLE) ×2 IMPLANT
GOWN STRL REUS W/ TWL XL LVL3 (GOWN DISPOSABLE) ×1 IMPLANT
KIT BASIN OR (CUSTOM PROCEDURE TRAY) ×1 IMPLANT
KIT ENCORE 26 ADVANTAGE (KITS) IMPLANT
KIT TURNOVER KIT B (KITS) ×1 IMPLANT
NDL PERC 18GX7CM (NEEDLE) ×1 IMPLANT
NEEDLE PERC 18GX7CM (NEEDLE) ×1 IMPLANT
PACK SRG BSC III STRL LF ECLPS (CUSTOM PROCEDURE TRAY) ×1 IMPLANT
PAD ARMBOARD 7.5X6 YLW CONV (MISCELLANEOUS) ×2 IMPLANT
PROTECTION STATION PRESSURIZED (MISCELLANEOUS) ×1
SET MICROPUNCTURE 5F STIFF (MISCELLANEOUS) ×1 IMPLANT
SHEATH CATAPULT 7FR 45 (SHEATH) IMPLANT
SHEATH PINNACLE 5F 10CM (SHEATH) IMPLANT
STATION PROTECTION PRESSURIZED (MISCELLANEOUS) IMPLANT
STENT VIABAHN 8X50X120 (Permanent Stent) IMPLANT
STENT VIABAHN5X120X8X (Permanent Stent) ×1 IMPLANT
STOPCOCK MORSE 400PSI 3WAY (MISCELLANEOUS) ×1 IMPLANT
SYR 10ML LL (SYRINGE) ×3 IMPLANT
SYR 20ML LL LF (SYRINGE) ×1 IMPLANT
SYR 30ML LL (SYRINGE) ×1 IMPLANT
TOWEL GREEN STERILE (TOWEL DISPOSABLE) ×2 IMPLANT
TUBING HIGH PRESSURE 120CM (CONNECTOR) IMPLANT
TUBING INJECTOR 48 (MISCELLANEOUS) IMPLANT
UNDERPAD 30X36 HEAVY ABSORB (UNDERPADS AND DIAPERS) IMPLANT
WIRE BENTSON .035X145CM (WIRE) ×1 IMPLANT
WIRE G V18X300 ST (WIRE) IMPLANT
WIRE TORQFLEX AUST .018X40CM (WIRE) IMPLANT

## 2023-03-05 NOTE — Transfer of Care (Signed)
 Immediate Anesthesia Transfer of Care Note  Patient: Bradley Scott  Procedure(s) Performed: EZELLA (Right: Groin) ENDOVASCULAR STENT GRAFT INSERTION INTO SUPERFICIAL FEMORAL ARTERY USING VIABAHN STENT X 5CM (Right: Groin)  Patient Location: PACU  Anesthesia Type:MAC  Level of Consciousness: awake, alert , and oriented  Airway & Oxygen Therapy: Patient Spontanous Breathing  Post-op Assessment: Report given to RN and Post -op Vital signs reviewed and stable  Post vital signs: Reviewed and stable  Last Vitals:  Vitals Value Taken Time  BP 110/65 03/05/23 1538  Temp    Pulse 99 03/05/23 1543  Resp 12 03/05/23 1543  SpO2 93 % 03/05/23 1543  Vitals shown include unfiled device data.  Last Pain:  Vitals:   03/05/23 1338  TempSrc: Oral  PainSc:          Complications: No notable events documented.

## 2023-03-05 NOTE — TOC Initial Note (Signed)
 Transition of Care [facility]) - Initial/Assessment Note    Patient Details  Name: Bradley Scott MRN: [id number] Date of Birth: 02-03-1976  Transition of Care [facility]) CM/SW Contact:    Justina Delcia Czar, RN Phone Number: [phone] 03/05/2023, 6:01 PM  Clinical Narrative:                  CM spoke to pt and wife at bedside. States they have emailed his FMLA paperwork to her and pt needs to have completed. Will follow up with provider to complete.  Pt does not have a PCP.  He works full-time and has insurance but does not have new cards.  Provided wife with CM contact information.   Expected Discharge Plan: Home/Self Care Barriers to Discharge: Continued Medical Work up   Patient Goals and CMS Choice Patient states their goals for this hospitalization and ongoing recovery are:: patient wants to recover fully          Expected Discharge Plan and Services   Discharge Planning Services: CM Consult   Living arrangements for the past 2 months: Single Family Home                                      Prior Living Arrangements/Services Living arrangements for the past 2 months: Single Family Home Lives with:: Spouse Patient language and need for interpreter reviewed:: Yes Do you feel safe going back to the place where you live?: Yes      Need for Family Participation in Patient Care: No (Comment) Care giver support system in place?: Yes (comment)   Criminal Activity/Legal Involvement Pertinent to Current Situation/Hospitalization: No - Comment as needed  Activities of Daily Living   ADL Screening (condition at time of admission) Independently performs ADLs?: Yes (appropriate for developmental age) Is the patient deaf or have difficulty hearing?: No Does the patient have difficulty seeing, even when wearing glasses/contacts?: No Does the patient have difficulty concentrating, remembering, or making decisions?: No  Permission Sought/Granted Permission sought to share  information with : Case Manager, Family Supports Permission granted to share information with : Yes, Verbal Permission Granted  Share Information with NAME: Wade Asebedo  Permission granted to share info w AGENCY: PCP  Permission granted to share info w Relationship: wife  Permission granted to share info w Contact Information: [phone]  Emotional Assessment Appearance:: Appears stated age Attitude/Demeanor/Rapport: Engaged Affect (typically observed): Accepting Orientation: : Oriented to Self, Oriented to Place, Oriented to  Time, Oriented to Situation   Psych Involvement: No (comment)  Admission diagnosis:  Partially successful PCI to LAD Elevated LVEDP Patient Active Problem List   Diagnosis Date Noted   STEMI involving left anterior descending coronary artery (HCC) 03/04/2023   ADD (attention deficit disorder) 10/02/2017   Obesity 10/02/2017   Psoriasis 10/02/2017   Tobacco abuse 10/02/2017   PCP:  Myrla Jon HERO, MD Pharmacy:   [facility] [phone] [address], [place] - [address] [place] [address] Phone: [phone] Fax: [phone]     Social Drivers of Health (SDOH) Social History: SDOH Screenings   Food Insecurity: No Food Insecurity (03/05/2023)  Housing: Low Risk  (03/05/2023)  Transportation Needs: No Transportation Needs (03/05/2023)  Utilities: Not At Risk (03/05/2023)  Alcohol Screen: Low Risk  (10/02/2017)  Tobacco Use: High Risk (03/05/2023)   SDOH Interventions:     Readmission Risk Interventions     No  data to display

## 2023-03-05 NOTE — Op Note (Signed)
    Patient name: Bradley Scott MRN: [id number] DOB: Jan 13, 1976 Sex: male  03/05/2023 Pre-operative Diagnosis: Large R SFA pseudoaneurysm Post-operative diagnosis:  Same Surgeon:  Norman GORMAN Serve, MD Procedure Performed:  Ultrasound-guided access of left common femoral artery Right femoral angiogram Third order cannulation of right SFA Viabahn stent placement to right SFA, 8mm x 5 cm Viabahn Pro-glide closure of left common femoral artery  Indications:  [name] is a 48 y/o male who underwent DES placement yesterday for STEMI and a balloon pump was attempted to be placed via R groin access but would not pass. He was then transferred to [facility]. CT scan was obtained which identified a large pseudoaneurysm stemming from the proximal SFA. Risks and benefits for angiogram with covered stent placement were reviewed and he and his wife elected to proceed.   Findings:  Difficulty visualizing R SFA pseudoaneurysm Adequate placement of R SFA Viabahn stent   Procedure:  The patient was identified in the holding area and taken to the cath lab  The patient was then placed supine on the table and prepped and draped in the usual sterile fashion.  A time out was called.  Ultrasound was used to evaluate the left common femoral artery.  It was patent.  A digital ultrasound image was acquired.  A micropuncture needle was used to access the left common femoral artery under ultrasound guidance.  An 018 wire was advanced without resistance and a micropuncture sheath was placed.  The 018 wire was removed and a benson wire was placed.  The micropuncture sheath was exchanged for a 5 french sheath. Next, using the omniflush catheter and a glide advantage wire, the aortic bifurcation was crossed and the catheter was placed into theright external iliac artery and an angiogram was obtained of the right femoral vessels.  The pseudoaneurysm was not completely visualized although using measurements from the CT scan I knew  that it was within 5 cm from the femoral bifurcation.  The glide eventually was placed into the SFA and the 5 French sheath was exchanged for a 7 x 45 cm catapult sheath and the patient was systemically heparinized.  The glide advantage wire was exchanged for a V18 wire and an 8 mm x 5 cm Viabahn stent was placed over this wire and into the SFA.  A right groin angiogram was obtained again and the femoral bifurcation was marked on the screen.  The Viabahn stent was deployed just distal to the femoral bifurcation.  Completion angiography demonstrated wide patency and brisk flow through the stent.  The device was removed and the 7 French sheath was pulled back to the left external iliac and a Bentson wire was advanced into the aorta.  The left common femoral access was then closed with Pro-glide closure and 5 minutes of manual pressure was held with excellent hemostasis.   Norman GORMAN Serve MD [facility] Office: [phone]

## 2023-03-05 NOTE — Anesthesia Postprocedure Evaluation (Signed)
 Anesthesia Post Note  Patient: Bradley Scott  Procedure(s) Performed: EZELLA (Right: Groin) ENDOVASCULAR STENT GRAFT INSERTION INTO SUPERFICIAL FEMORAL ARTERY USING VIABAHN STENT X 5CM (Right: Groin)     Patient location during evaluation: PACU Anesthesia Type: MAC Level of consciousness: awake and alert Pain management: pain level controlled Vital Signs Assessment: post-procedure vital signs reviewed and stable Respiratory status: spontaneous breathing, nonlabored ventilation, respiratory function stable and patient connected to nasal cannula oxygen Cardiovascular status: blood pressure returned to baseline and stable Postop Assessment: no apparent nausea or vomiting Anesthetic complications: no   No notable events documented.  Last Vitals:  Vitals:   03/05/23 1933 03/05/23 1934  BP:    Pulse: 98 97  Resp: 19 (!) 22  Temp: 37 C 37 C  SpO2: 98% 99%    Last Pain:  Vitals:   03/05/23 1930  TempSrc: Core  PainSc: 0-No pain                 Garnette DELENA Gab

## 2023-03-05 NOTE — Plan of Care (Signed)
  Problem: Clinical Measurements: Goal: Ability to maintain clinical measurements within normal limits will improve Outcome: Progressing Goal: Will remain free from infection Outcome: Progressing Goal: Diagnostic test results will improve Outcome: Progressing Goal: Cardiovascular complication will be avoided Outcome: Progressing   Problem: Cardiovascular: Goal: Ability to achieve and maintain adequate cardiovascular perfusion will improve Outcome: Progressing

## 2023-03-05 NOTE — Progress Notes (Signed)
   Got called by RN after patient returned from PACU that EKG showed ST elevation in leads V2-V5. He is currently chest pain free. Reviewed EKG - ST elevation look improved from prior EKG. Reviewed with Dr. Loni who agrees. Echo earlier today showed apical akinesis which likely explains persistent ST elevation. No new recommendations.  Darvis Croft E Shannara Winbush, PA-C 03/05/2023 5:42 PM

## 2023-03-05 NOTE — Progress Notes (Addendum)
      Subjective  - no new complaints other than right groin   Objective 121/75 98 (!) 97 F (36.1 C) 16 90%  Intake/Output Summary (Last 24 hours) at 03/05/2023 0726 Last data filed at 03/05/2023 0600 Gross per 24 hour  Intake 819 ml  Output --  Net 819 ml   Right groin Hematoma with FemoStop in place  Maintained doppler DP/PT right LE, motor of toes intact Lungs non labored breathing   Assessment/Planning: 48 y.o. male status post attempted balloon pump placement with right SFA cannulation but balloon pump would not pass with resulting pseudoaneurysm of the SFA identified on CT scan.   FemStop in place with maintained doppler flow PT/DP Aggrastat  has been held currently pending Dr. Claretta assessment He is at high risk of wound problems due to his body habitus.  Dr. Sheree would prefer right SFA stenting from the left common femoral approach.  Plan to proceed with stent today. NPO Consent aortogram with runoff and possible stenting of the right SFA  Maurilio Deland Collet 03/05/2023 7:26 AM --  Laboratory Lab Results: Recent Labs    03/04/23 1910 03/05/23 0017  WBC 10.3 14.4*  HGB 16.8 14.6  HCT 49.4 43.0  PLT 182 188   BMET Recent Labs    03/04/23 1910 03/05/23 0017  NA 134* 132*  K 3.7 4.4  CL 99 100  CO2 22 22  GLUCOSE 134* 166*  BUN 18 18  CREATININE 0.99 1.15  CALCIUM  8.9 8.5*    COAG Lab Results  Component Value Date   INR 1.0 03/04/2023   No results found for: PTT  I have independently interviewed and examined patient and agree with PA assessment and plan above.  FemoStop continues to be in place but is actually quite a bit higher than the pseudoaneurysm itself is a pseudo is approximately 2-1/2 cm below the common femoral bifurcation.  Okay to remove as long as he does not have any bleeding issues.  Will plan for right SFA stenting from the left common femoral approach.  Certainly if there are issues with this he would require cutdown of the  right SFA with primary repair but given his habitus and the hematoma would prefer to avoid any groin wounds if possible.  Makaylah Oddo C. Sheree, MD [facility] Office: [phone] Pager: [phone]

## 2023-03-05 NOTE — Plan of Care (Signed)
   Problem: Education: Goal: Knowledge of General Education information will improve Description Including pain rating scale, medication(s)/side effects and non-pharmacologic comfort measures Outcome: Progressing   Problem: Health Behavior/Discharge Planning: Goal: Ability to manage health-related needs will improve Outcome: Progressing   Problem: Clinical Measurements: Goal: Ability to maintain clinical measurements within normal limits will improve Outcome: Progressing Goal: Will remain free from infection Outcome: Progressing Goal: Diagnostic test results will improve Outcome: Progressing Goal: Respiratory complications will improve Outcome: Progressing Goal: Cardiovascular complication will be avoided Outcome: Progressing   Problem: Elimination: Goal: Will not experience complications related to bowel motility Outcome: Progressing Goal: Will not experience complications related to urinary retention Outcome: Progressing

## 2023-03-05 NOTE — Anesthesia Preprocedure Evaluation (Addendum)
 Anesthesia Evaluation  Patient identified by MRN, date of birth, ID band Patient awake    Reviewed: Allergy & Precautions, NPO status , Patient's Chart, lab work & pertinent test results  Airway Mallampati: IV  TM Distance: >3 FB Neck ROM: Full    Dental no notable dental hx. (+) Teeth Intact, Dental Advisory Given   Pulmonary Current Smoker and Patient abstained from smoking.   Pulmonary exam normal breath sounds clear to auscultation       Cardiovascular hypertension, Pt. on medications and Pt. on home beta blockers + Past MI (03/04/2023 STEMI)  Normal cardiovascular exam Rhythm:Regular Rate:Normal  1. Anterior STEMI s/p PCI x2 LAD with resultant TIMI 3 Flow  - Discontinue Tirofiban   - Wean down nitroglycerin  as tolerated  - Continue ASA and tiagcrelor  - Begin rosuvastatin  20 mg  - Hold betablocker and ACEi overnight given low blood pressures prior to arrival  - ECHO in the morning  - PCWP 20 on swan LVEDP was 40 in the lab (with PWCP of 11). Will hold off on diuresis given active bleed and lower blood pressures.  - CI 2.9     Neuro/Psych negative neurological ROS     GI/Hepatic   Endo/Other    Class 3 obesity  Renal/GU Lab Results      Component                Value               Date                      NA                       136                 03/05/2023                CL                       104                 03/05/2023                K                        4.0                 03/05/2023                CO2                      24                  03/05/2023                BUN                      17                  03/05/2023                CREATININE               1.11                03/05/2023  GFRNONAA                 >60                 03/05/2023                CALCIUM                   8.4 (L)             03/05/2023                ALBUMIN                  4.4                 03/04/2023                 GLUCOSE                  143 (H)             03/05/2023                Musculoskeletal   Abdominal   Peds  Hematology Lab Results      Component                Value               Date                      WBC                      11.0 (H)            03/05/2023                HGB                      13.4                03/05/2023                HCT                      40.4                03/05/2023                MCV                      93.1                03/05/2023                PLT                      171                 03/05/2023              Anesthesia Other Findings Right Femora Artery Pseudoaneurysm   Reproductive/Obstetrics                             Anesthesia Physical Anesthesia Plan  ASA: 4 and emergent  Anesthesia Plan: MAC   Post-op Pain Management: Tylenol  PO (pre-op)*   Induction: Intravenous  PONV Risk Score  and Plan: Treatment may vary due to age or medical condition and Midazolam   Airway Management Planned: Natural Airway, Nasal Cannula and Simple Face Mask  Additional Equipment: None  Intra-op Plan:   Post-operative Plan:   Informed Consent: I have reviewed the patients History and Physical, chart, labs and discussed the procedure including the risks, benefits and alternatives for the proposed anesthesia with the patient or authorized representative who has indicated his/her understanding and acceptance.     Dental advisory given  Plan Discussed with: CRNA, Anesthesiologist and Surgeon  Anesthesia Plan Comments:         Anesthesia Quick Evaluation

## 2023-03-05 NOTE — H&P (Addendum)
 Cardiology Admission History and Physical:   Patient ID: Bradley Scott MRN: [id number]; DOB: 08-Feb-1976   Admission date: 03/04/2023  Primary Care Provider: Myrla Bradley Scott, Bradley Scott Primary Cardiologist: None  Primary Electrophysiologist:  None   Chief Complaint:  Anterior STEMI  Patient Profile:   Bradley Scott is a 48 y.o. male with hypertension who presents with anterior STEMI from OSH with concern for right femoral artery pseudoaneurysm.   History of Present Illness:   Bradley Scott states that yesterday he felt like he had a burning sensation in his chest.  He presented to the emergency room today with an anterior STEMI left heart cath demonstrated triple-vessel disease with the LAD is the culprit vessel with 100% stenosis proximally.  High-grade lesions present in the third marginal artery and the distal circumflex about 75% stenosis.  Drug-eluting stents placed in the proximal and mid LAD with resultant TIMI-3 flow.  LVEDP was 40. Bradley Scott continues to have chest pain at this time, Attempt to place IABP in right femoral artery. However there was difficulty inserting the sheath. A hematoma resulted.  A FemStop was placed and he was transferred over to [facility] for concern for need for higher level care.   Past Medical History:  Diagnosis Date   ADHD     Past Surgical History:  Procedure Laterality Date   WISDOM TOOTH EXTRACTION       Medications Prior to Admission: Prior to Admission medications   Medication Sig Start Date End Date Taking? Authorizing Provider  clobetasol  ointment (TEMOVATE ) 0.05 % Apply 1 application topically 2 (two) times daily. 10/02/17   Bacigalupo, Bradley Scott, Bradley Scott     Allergies:   No Known Allergies  Social History:   Social History   Socioeconomic History   Marital status: Married    Spouse name: Katie   Number of children: 2   Years of education: 12   Highest education level: High school graduate  Occupational History   Occupation:  nature conservation officer    Employer: [employer]  Tobacco Use   Smoking status: Every Day    Current packs/day: 1.00    Average packs/day: 1 pack/day for 10.0 years (10.0 ttl pk-yrs)    Types: Cigarettes   Smokeless tobacco: Never   Tobacco comments:    started smoking in 2009.  Vaping Use   Vaping status: Never Used  Substance and Sexual Activity   Alcohol use: Yes    Comment: very occasional, socially   Drug use: Never   Sexual activity: Yes    Partners: Female  Other Topics Concern   Not on file  Social History Narrative   Not on file   Social Drivers of Health   Financial Resource Strain: Not on file  Food Insecurity: Not on file  Transportation Needs: Not on file  Physical Activity: Not on file  Stress: Not on file  Social Connections: Not on file  Intimate Partner Violence: Not on file    Family History:   The patient's family history includes ADD / ADHD in his son; Breast cancer (age of onset: 60) in his mother; Obesity in his father. There is no history of Colon cancer or Prostate cancer.    Review of Systems: [y] = yes, [ ]  = no    General: Weight gain [ ] ; Weight loss [ ] ; Anorexia [ ] ; Fatigue [ ] ; Fever [ ] ; Chills [ ] ; Weakness [ ]   Cardiac: Chest pain/pressure [ y]; Resting SOB [ ] ; Exertional  SOB [ ] ; Orthopnea [ ] ; Pedal Edema [ ] ; Palpitations [ ] ; Syncope [ ] ; Presyncope [ ] ; Paroxysmal nocturnal dyspnea[ ]   Pulmonary: Cough [ ] ; Wheezing[ ] ; Hemoptysis[ ] ; Sputum [ ] ; Snoring [ ]   GI: Vomiting[ ] ; Dysphagia[ ] ; Melena[ ] ; Hematochezia [ ] ; Heartburn[ ] ; Abdominal pain [ ] ; Constipation [ ] ; Diarrhea [ ] ; BRBPR [ ]   GU: Hematuria[ ] ; Dysuria [ ] ; Nocturia[ ]   Vascular: Pain in legs with walking [ ] ; Pain in feet with lying flat [ ] ; Non-healing sores [ ] ; Stroke [ ] ; TIA [ ] ; Slurred speech [ ] ;  Neuro: Headaches[ ] ; Vertigo[ ] ; Seizures[ ] ; Paresthesias[ ] ;Blurred vision [ ] ; Diplopia [ ] ; Vision changes [ ]   Ortho/Skin: Arthritis [ ] ; Joint pain  [ ] ; Muscle pain [ ] ; Joint swelling [ ] ; Back Pain [ ] ; Rash [ ]   Psych: Depression[ ] ; Anxiety[ ]   Heme: Bleeding problems [ ] ; Clotting disorders [ ] ; Anemia [ ]   Endocrine: Diabetes [ ] ; Thyroid dysfunction[ ]   Physical Exam/Data:   Vitals:   03/05/23 0145 03/05/23 0200 03/05/23 0215 03/05/23 0230  BP: 120/70 112/75 (!) 143/129 111/61  Pulse: 100 100 99 97  Resp: 14 (!) 23 10 20   Temp: (!) 97.3 F (36.3 C) 97.7 F (36.5 C) 97.9 F (36.6 C) (!) 97.5 F (36.4 C)  SpO2: 97% 95% 93% 90%    Intake/Output Summary (Last 24 hours) at 03/05/2023 0244 Last data filed at 03/05/2023 0200 Gross per 24 hour  Intake 238.2 ml  Output --  Net 238.2 ml   There were no vitals filed for this visit. There is no height or weight on file to calculate BMI.  General:  Well nourished, well developed, in no acute distress HEENT: normal Lymph: no adenopathy Neck: no JVD Endocrine:  No thryomegaly Vascular: No carotid bruits; FA pulses 2+ bilaterally without bruits  Cardiac: Increased rate, normal rhythm. Lungs:  clear to auscultation bilaterally, no wheezing, rhonchi or rales  Abd: soft, nontender, no hepatomegaly  Ext: Large right groin hematoma present.  Very tender upon palpation.  Light pulse palpable just proximal to the hematoma.  thrill present Musculoskeletal:  No deformities, BUE and BLE strength normal and equal Skin: warm and dry  Psych:  Normal affect    EKG:  The ECG that was done January 1 at 1858 was personally reviewed and demonstrates sinus tachycardia with anterior and lateral ST segment elevations (1, aVL, V1, V2 V3 V4) with Q waves.  Relevant CV Studies: March 04, 2023 left heart cath   Prox LAD to Mid LAD lesion is 100% stenosed.   Mid LAD lesion is 75% stenosed.   Dist LAD lesion is 80% stenosed.   3rd Mrg lesion is 75% stenosed.   Dist Cx lesion is 75% stenosed.   Ost LAD to Prox LAD lesion is 25% stenosed.   A drug-eluting stent was successfully placed using a STENT  ONYX FRONTIER 2.5X34.   A drug-eluting stent was successfully placed using a STENT ONYX FRONTIER 3.0X30.  Laboratory Data:  Chemistry Recent Labs  Lab 03/04/23 1910 03/05/23 0017  NA 134* 132*  K 3.7 4.4  CL 99 100  CO2 22 22  GLUCOSE 134* 166*  BUN 18 18  CREATININE 0.99 1.15  CALCIUM  8.9 8.5*  GFRNONAA >60 >60  ANIONGAP 13 10    Recent Labs  Lab 03/04/23 1910  PROT 7.9  ALBUMIN 4.4  AST 46*  ALT 49*  ALKPHOS 49  BILITOT  1.1   Hematology Recent Labs  Lab 03/04/23 1910 03/05/23 0017  WBC 10.3 14.4*  RBC 5.38 4.73  HGB 16.8 14.6  HCT 49.4 43.0  MCV 91.8 90.9  MCH 31.2 30.9  MCHC 34.0 34.0  RDW 13.2 13.5  PLT 182 188   Cardiac EnzymesNo results for input(s): TROPONINI in the last 168 hours. No results for input(s): TROPIPOC in the last 168 hours.  BNPNo results for input(s): BNP, PROBNP in the last 168 hours.  DDimer No results for input(s): DDIMER in the last 168 hours.  Radiology/Studies:  Prox LAD to Mid LAD lesion is 100% stenosed.   Mid LAD lesion is 75% stenosed.   Dist LAD lesion is 80% stenosed.   3rd Mrg lesion is 75% stenosed.   Dist Cx lesion is 75% stenosed.   Ost LAD to Prox LAD lesion is 25% stenosed.   Assessment and Plan:   Anterior STEMI s/p PCI x2 LAD with resultant TIMI 3 Flow  - Discontinue Tirofiban   - Wean down nitroglycerin  as tolerated  - Continue ASA and tiagcrelor  - Begin rosuvastatin  20 mg  - Hold betablocker and ACEi overnight given low Scott pressures prior to arrival  - ECHO in the morning  - PCWP 20 on swan LVEDP was 40 in the lab (with PWCP of 11). Will hold off on diuresis given active bleed and lower Scott pressures.  - CI 2.9   Right Femora Artery Pseudoaneurysm  Attempt to place IABP in femoral artery was made, but there was difficulty placing the sheath. IABP was aborted, and right hematoma resulted. Due to large hematoma, CT was obtained which showed active extravasation. - Ultrasound ordered,  unable to be obtained overnight  - Vascular consulted - Discontinue tirofiban    3.  Secondary prevention - LDL 148, triglycerides 127 - Begin rosuvastatin  20 - Hemoglobin A1c pending  Severity of Illness: The appropriate patient status for this patient is INPATIENT. Inpatient status is judged to be reasonable and necessary in order to provide the required intensity of service to ensure the patient's safety. The patient's presenting symptoms, physical exam findings, and initial radiographic and laboratory data in the context of their chronic comorbidities is felt to place them at high risk for further clinical deterioration. Furthermore, it is not anticipated that the patient will be medically stable for discharge from the hospital within 2 midnights of admission.   * I certify that at the point of admission it is my clinical judgment that the patient will require inpatient hospital care spanning beyond 2 midnights from the point of admission due to high intensity of service, high risk for further deterioration and high frequency of surveillance required.*   For questions or updates, please contact [facility] Please consult [email or web address] for contact info under        Signed, Bradley JAYSON Blood, Bradley Scott  03/05/2023 2:44 AM    -------------------------------------------------------------------------------------------------------------------------  After conducting a review of all available clinical information with the care team, interviewing the patient, and performing a physical exam, I agree with the findings and plan described in this note.   48 year old male with hypertension, obesity, anterior STEMI s/p primary PCI to mid LAD with 2 overlapping stents (Bradley Scott at [facility]), right SFA pseudoaneurysm with attempted balloon pump placement for LV unloading.  Presentation with anterior STEMI, successfully treated with 2 overlapping stents in mid LAD, with other diffuse disease noted.   LVEF <20% on LV gram.  LVEDP 40 mmHg, without definite cardiogenic shock.  Patient  was transferred to [facility] with concern that he may need mechanical support given his anterior STEMI and high filling pressures.  IABP placement was attempted at [facility], but was unsuccessful due to access issues.  On transfer to [facility], he has been hemodynamically stable with cardiac index consistently >2.5 suggesting of cardiogenic shock.  Lactic acid is normal.  He has large ecchymosis in right groin.  Patient was transferred on IV Aggrastat , which was stopped around midnight.  CTA showed right SFA pseudoaneurysm.  Patient was evaluated by vascular surgery.  Given his body habitus, bleeding risk, and risk for wound related issues, open exploration was deemed unfavorable.  Dr. Sheree recommended SFA stenting from left femoral approach, scheduled for later today.  Morbidly obese BP 114/59 mmHg, HR 94 bpm Heart sounds normal,  murmur No rales/JVD/edema Rt foot cool to touch. DP detected on US , PT palpable 2+  CI 2.7 Lactic acid 0.7 Na 132  STEMI: Anterior STEMI. Culprit mid LAD, treated with 2 overlapping stents. Moderate to severe disease distal Lcx/OM3-medical treatment for now. Continue DAPT with aspirin  and Brilinta  at least till 03/2024. Continue Crestor  40 mg daily.   Right SFA pseudoaneurysm: Complication from unsuccessful access attempt for IABP placement. Wean off Femostop.  Plan for SFA stenting today by vascular surgery.  HFrEF: Formal echocardiogram pending. Suspect severe systolic failure secondary anterior MI. No cardiogenic shock at this time. Await GDMT initiation until after SFA stenting today. Will add losartan  on 1/3. Will discontinue Swan catheter after SFA stenting.  CRITICAL CARE Performed by: Bradley Scott   Total critical care time: 40 minutes   Critical care time was exclusive of separately billable procedures and treating other patients.   Critical care was  necessary to treat or prevent imminent or life-threatening deterioration.   Critical care was time spent personally by me on the following activities: development of treatment plan with patient and/or surrogate as well as nursing, discussions with consultants, evaluation of patient's response to treatment, examination of patient, obtaining history from patient or surrogate, ordering and performing treatments and interventions, ordering and review of laboratory studies, ordering and review of radiographic studies, pulse oximetry and re-evaluation of patient's condition.      Bradley Lawrence, Bradley Scott

## 2023-03-05 NOTE — Consult Note (Signed)
 Hospital Consult    Reason for Consult:  Right SFA pseudoaneurysm Referring Physician:  Dr. Elmira MRN #:  [id number]  History of Present Illness: This is a 48 y.o. male underwent drug-eluting stent placement yesterday for anterior STEMI there was attempted balloon pump placement with difficulty placing the sheath with resulting hematoma.  He is now transferred to [facility] for definitive groin management and has undergone CT scan.  Patient states that other than pain in the right groin he really does not have any complaints at this time and his foot feels normal on the right.  Past Medical History:  Diagnosis Date   ADHD     Past Surgical History:  Procedure Laterality Date   WISDOM TOOTH EXTRACTION      No Known Allergies  Prior to Admission medications   Medication Sig Start Date End Date Taking? Authorizing Provider  clobetasol  ointment (TEMOVATE ) 0.05 % Apply 1 application topically 2 (two) times daily. 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  Vaping Use   Vaping status: Never Used  Substance and Sexual Activity   Alcohol use: Yes    Comment: very occasional, socially   Drug use: Never   Sexual activity: Yes    Partners: Female  Other Topics Concern   Not on file  Social History Narrative   Not on file   Social Drivers of Health   Financial Resource Strain: Not on file  Food Insecurity: Not on file  Transportation Needs: Not on file  Physical  Activity: Not on file  Stress: Not on file  Social Connections: Not on file  Intimate Partner Violence: Not on file     Family History  Problem Relation Age of Onset   Breast cancer Mother 62   Obesity Father    ADD / ADHD Son    Colon cancer Neg Hx    Prostate cancer Neg Hx     Review of Systems  Constitutional: Negative.   HENT: Negative.    Eyes: Negative.   Respiratory: Negative.    Cardiovascular: Negative.   Gastrointestinal: Negative.   Musculoskeletal:        Right groin pain  Skin: Negative.   Neurological: Negative.   Endo/Heme/Allergies: Negative.   Psychiatric/Behavioral: Negative.        Physical Examination  Vitals:   03/05/23 0230 03/05/23 0300  BP: 111/61 128/88  Pulse: 97 93  Resp: 20 12  Temp: (!) 97.5 F (36.4 C) (!) 97.3 F (36.3 C)  SpO2: 90% 90%   There is no height or weight on file to calculate BMI.  Physical Exam Constitutional:      Appearance: He is obese.  Neck:     Comments: Swan-Ganz catheter in place Cardiovascular:     Rate and Rhythm: Normal rate.     Pulses:          Dorsalis pedis pulses are detected w/  Doppler on the right side.       Posterior tibial pulses are detected w/ Doppler on the right side.  Abdominal:     General: Abdomen is flat.     Palpations: Abdomen is soft.  Skin:    Capillary Refill: Capillary refill takes less than 2 seconds.     Comments: Large right groin hematoma that is soft  Neurological:     General: No focal deficit present.     Mental Status: He is alert.      CBC    Component Value Date/Time   WBC 14.4 (H) 03/05/2023 0017   RBC 4.73 03/05/2023 0017   HGB 14.6 03/05/2023 0017   HCT 43.0 03/05/2023 0017   PLT 188 03/05/2023 0017   MCV 90.9 03/05/2023 0017   MCH 30.9 03/05/2023 0017   MCHC 34.0 03/05/2023 0017   RDW 13.5 03/05/2023 0017   LYMPHSABS 2.6 03/04/2023 1910   MONOABS 0.5 03/04/2023 1910   EOSABS 0.1 03/04/2023 1910   BASOSABS 0.1 03/04/2023 1910    BMET     Component Value Date/Time   NA 132 (L) 03/05/2023 0017   K 4.4 03/05/2023 0017   CL 100 03/05/2023 0017   CO2 22 03/05/2023 0017   GLUCOSE 166 (H) 03/05/2023 0017   BUN 18 03/05/2023 0017   CREATININE 1.15 03/05/2023 0017   CALCIUM  8.5 (L) 03/05/2023 0017   GFRNONAA >60 03/05/2023 0017    COAGS: Lab Results  Component Value Date   INR 1.0 03/04/2023     Non-Invasive Vascular Imaging:   CT IMPRESSION: VASCULAR   Changes consistent with extensive hemorrhage in the subcutaneous tissues as described. A discrete focal hematoma is not appreciated as the hemorrhage interdigitates in the subcutaneous tissues.   Changes consistent with a pseudoaneurysm adjacent to the proximal superficial femoral artery. The actual size of the aneurysm is difficult to assess due to adjacent hemorrhage. Ultrasound may be helpful in this regard. Additionally the pseudoaneurysm appears to arise from the proximal superficial femoral artery.   No significant occlusive disease is noted although the infrapopliteal vessels are somewhat difficult to evaluate due to poor cardiac output.     ASSESSMENT/PLAN:  48 y.o. male status post attempted balloon pump placement with right SFA cannulation but balloon pump would not pass with resulting pseudoaneurysm of the SFA identified on CT scan.  Given the patient is on Brilinta  and Aggrastat  with a large groin hematoma and with FemoStop in place he will be at high risk for significant groin wound with any operation.  As such I discussed with the patient and his family at bedside that I would prefer right SFA stenting from the left common femoral approach.  Given that he is currently hemodynamically stable and H&H does not appear to have dropped and right foot is well-perfused and groin is soft I do not think this needs to be done urgently.  I will reevaluate in a couple hours and we can schedule accordingly.  Certainly if he has any changes in the interim we will take  him urgently to the operating room.  Sheila Ocasio C. Sheree, MD [facility] Office: [phone] Pager: [phone]

## 2023-03-06 ENCOUNTER — Other Ambulatory Visit (HOSPITAL_COMMUNITY): Payer: Self-pay

## 2023-03-06 ENCOUNTER — Telehealth: Payer: Self-pay | Admitting: Cardiology

## 2023-03-06 ENCOUNTER — Encounter: Payer: Self-pay | Admitting: Internal Medicine

## 2023-03-06 DIAGNOSIS — I10 Essential (primary) hypertension: Secondary | ICD-10-CM | POA: Diagnosis not present

## 2023-03-06 DIAGNOSIS — I213 ST elevation (STEMI) myocardial infarction of unspecified site: Secondary | ICD-10-CM | POA: Diagnosis present

## 2023-03-06 DIAGNOSIS — I729 Aneurysm of unspecified site: Secondary | ICD-10-CM | POA: Insufficient documentation

## 2023-03-06 DIAGNOSIS — E785 Hyperlipidemia, unspecified: Secondary | ICD-10-CM | POA: Insufficient documentation

## 2023-03-06 DIAGNOSIS — E782 Mixed hyperlipidemia: Secondary | ICD-10-CM | POA: Diagnosis not present

## 2023-03-06 DIAGNOSIS — T81718A Complication of other artery following a procedure, not elsewhere classified, initial encounter: Secondary | ICD-10-CM | POA: Diagnosis not present

## 2023-03-06 DIAGNOSIS — E118 Type 2 diabetes mellitus with unspecified complications: Secondary | ICD-10-CM | POA: Insufficient documentation

## 2023-03-06 DIAGNOSIS — I2102 ST elevation (STEMI) myocardial infarction involving left anterior descending coronary artery: Secondary | ICD-10-CM | POA: Diagnosis not present

## 2023-03-06 DIAGNOSIS — E1159 Type 2 diabetes mellitus with other circulatory complications: Secondary | ICD-10-CM | POA: Insufficient documentation

## 2023-03-06 DIAGNOSIS — E1169 Type 2 diabetes mellitus with other specified complication: Secondary | ICD-10-CM | POA: Insufficient documentation

## 2023-03-06 LAB — CBC
HCT: 36.7 % — ABNORMAL LOW (ref 39.0–52.0)
Hemoglobin: 12.2 g/dL — ABNORMAL LOW (ref 13.0–17.0)
MCH: 30.9 pg (ref 26.0–34.0)
MCHC: 33.2 g/dL (ref 30.0–36.0)
MCV: 92.9 fL (ref 80.0–100.0)
Platelets: 144 10*3/uL — ABNORMAL LOW (ref 150–400)
RBC: 3.95 MIL/uL — ABNORMAL LOW (ref 4.22–5.81)
RDW: 13.5 % (ref 11.5–15.5)
WBC: 9.8 10*3/uL (ref 4.0–10.5)
nRBC: 0 % (ref 0.0–0.2)

## 2023-03-06 LAB — BASIC METABOLIC PANEL
Anion gap: 11 (ref 5–15)
BUN: 18 mg/dL (ref 6–20)
CO2: 22 mmol/L (ref 22–32)
Calcium: 8.6 mg/dL — ABNORMAL LOW (ref 8.9–10.3)
Chloride: 103 mmol/L (ref 98–111)
Creatinine, Ser: 0.92 mg/dL (ref 0.61–1.24)
GFR, Estimated: >60 mL/min (ref >60)
Glucose, Bld: 134 mg/dL — ABNORMAL HIGH (ref 70–99)
Potassium: 3.7 mmol/L (ref 3.5–5.1)
Sodium: 136 mmol/L (ref 135–145)

## 2023-03-06 LAB — LIPOPROTEIN A (LPA): Lipoprotein (a): 39.9 nmol/L — ABNORMAL HIGH (ref <75.0)

## 2023-03-06 LAB — COOXEMETRY PANEL
Carboxyhemoglobin: 2.8 % — ABNORMAL HIGH (ref 0.5–1.5)
Methemoglobin: <0.7 % (ref 0.0–1.5)
O2 Saturation: 80.1 %
Total hemoglobin: 12.3 g/dL (ref 12.0–16.0)

## 2023-03-06 LAB — MAGNESIUM: Magnesium: 2.2 mg/dL (ref 1.7–2.4)

## 2023-03-06 MED ORDER — ROSUVASTATIN CALCIUM 20 MG PO TABS
20.0000 mg | ORAL_TABLET | Freq: Every day | ORAL | 1 refills | Status: DC
  Filled 2023-03-06 – 2023-05-31 (×2): qty 90, 90d supply, fill #0

## 2023-03-06 MED ORDER — METFORMIN HCL 500 MG PO TABS
500.0000 mg | ORAL_TABLET | Freq: Two times a day (BID) | ORAL | 1 refills | Status: DC
  Filled 2023-03-06 – 2023-03-29 (×2): qty 60, 30d supply, fill #0

## 2023-03-06 MED ORDER — TICAGRELOR 90 MG PO TABS
90.0000 mg | ORAL_TABLET | Freq: Two times a day (BID) | ORAL | 2 refills | Status: DC
  Filled 2023-03-06 – 2023-05-31 (×2): qty 180, 90d supply, fill #0

## 2023-03-06 MED ORDER — LOSARTAN POTASSIUM 25 MG PO TABS
12.5000 mg | ORAL_TABLET | Freq: Every day | ORAL | 2 refills | Status: DC
  Filled 2023-03-06: qty 15, 30d supply, fill #0

## 2023-03-06 MED ORDER — NITROGLYCERIN 0.4 MG SL SUBL
0.4000 mg | SUBLINGUAL_TABLET | SUBLINGUAL | 2 refills | Status: AC | PRN
  Filled 2023-03-06: qty 25, 8d supply, fill #0

## 2023-03-06 MED ORDER — LOSARTAN POTASSIUM 25 MG PO TABS
12.5000 mg | ORAL_TABLET | Freq: Every day | ORAL | Status: DC
  Administered 2023-03-06: 12.5 mg via ORAL
  Filled 2023-03-06: qty 1

## 2023-03-06 MED ORDER — METFORMIN HCL 500 MG PO TABS: 500.0000 mg | ORAL_TABLET | Freq: Two times a day (BID) | ORAL | Status: DC

## 2023-03-06 MED ORDER — METOPROLOL SUCCINATE ER 25 MG PO TB24
12.5000 mg | ORAL_TABLET | Freq: Every day | ORAL | Status: DC
  Administered 2023-03-06: 12.5 mg via ORAL
  Filled 2023-03-06: qty 1

## 2023-03-06 MED ORDER — METOPROLOL SUCCINATE ER 25 MG PO TB24
12.5000 mg | ORAL_TABLET | Freq: Every day | ORAL | 3 refills | Status: DC
  Filled 2023-03-06: qty 15, 30d supply, fill #0

## 2023-03-06 MED ORDER — ASPIRIN 81 MG PO CHEW
81.0000 mg | CHEWABLE_TABLET | Freq: Every day | ORAL | 2 refills | Status: DC
  Filled 2023-03-06: qty 90, 90d supply, fill #0

## 2023-03-06 NOTE — Telephone Encounter (Signed)
   Transition of Care Follow-up Phone Call Request    Patient Name: YUSEF LAMP Date of Birth: 05/07/1975 Date of Encounter: 03/06/2023  Primary Care Provider:  Myrla Jon HERO, MD Primary Cardiologist:  Newman JINNY Lawrence, MD  Dorise MARLA Breen has been scheduled for a transition of care follow up appointment with a [facility] provider:  Rosaline Bane 1/16  Please reach out to Dorise MARLA Breen within 48 hours of discharge to confirm appointment and review transition of care protocol questionnaire. Anticipated discharge date: 1/3  Manuelita Rummer, NP  03/06/2023, 2:09 PM

## 2023-03-06 NOTE — Discharge Summary (Signed)
 Discharge Summary    Patient ID: Bradley Scott MRN: [id number]; DOB: August 02, 1975  Admit date: 03/04/2023 Discharge date: 03/06/2023  PCP:  Myrla Jon HERO, MD   [facility] Providers Cardiologist:  Newman JINNY Lawrence, MD     Discharge Diagnoses    Principal Problem:   STEMI involving left anterior descending coronary artery [facility]) Active Problems:   Tobacco abuse   Type 2 diabetes mellitus with complication, without long-term current use of insulin (HCC)   Pseudoaneurysm (HCC)   Hyperlipidemia   STEMI (ST elevation myocardial infarction) [facility])  Diagnostic Studies/Procedures    Cath: 03/04/2023    Prox LAD to Mid LAD lesion is 100% stenosed.   Mid LAD lesion is 75% stenosed.   Dist LAD lesion is 80% stenosed.   3rd Mrg lesion is 75% stenosed.   Dist Cx lesion is 75% stenosed.   Ost LAD to Prox LAD lesion is 25% stenosed.   A drug-eluting stent was successfully placed using a STENT ONYX FRONTIER 2.5X34.   A drug-eluting stent was successfully placed using a STENT ONYX FRONTIER 3.0X30.   Post intervention, there is a 0% residual stenosis.   Post intervention, there is a 0% residual stenosis.   There is moderate to severe left ventricular systolic dysfunction.   LV end diastolic pressure is severely elevated.   The left ventricular ejection fraction is 35-45% by visual estimate.   Anticipated discharge date to be determined.   Recommend uninterrupted dual antiplatelet therapy with Aspirin  81mg  daily and Ticagrelor  90mg  twice daily for a minimum of 12 months (ACS-Class I recommendation)   LVEDP 40 MeanPA  26 , Mean wedge 11   Conclusion Anterior STEMI occluded mid LAD TIMI 0 flow Right radial approach   Left main large free of disease LAD 100% mid LAD IRA TIMI 0 flow Circumflex large with minor irregularities and mild distal disease RCA minor irregularities   Intervention Partial successful PCI and stent of 2 frontier Onyx overlapping mid to distal LAD 3.0  x 30 mm proximal and 2.5 x 34 mm mid to distal with TIMI II flow to the distal LAD   TIMI-3 flow was never fully restored Evidence of no reflow Patient received escalating doses of adenosine  72 and then 96 as well as 200 mg of nitroglycerin  Unsuccessful attempt at intra-aortic balloon pump unable to place the sheath because of tortuosity Patient developed a moderate-sized hematoma which was held and reduced manually .  FemoStop was eventually placed to help with hemostasis   Right IJ Norva was placed to measure pulmonary pressures Mean wedge was 11 Mean PA was 26 Patient was maintained on Aggrastat  Transferred to [facility] under Dr. Ritchie  Diagnostic Dominance: Right  Intervention    Echo: 03/05/2023  IMPRESSIONS     1. Left ventricular ejection fraction, by estimation, is 50 to 55%. The  left ventricle has low normal function. The left ventricle demonstrates  regional wall motion abnormalities (see scoring diagram/findings for  description). There is moderate  asymmetric left ventricular hypertrophy of the septal segment. Left  ventricular diastolic parameters are consistent with Grade I diastolic  dysfunction (impaired relaxation).   2. Right ventricular systolic function is normal. The right ventricular  size is normal.   3. The mitral valve is grossly normal. Trivial mitral valve  regurgitation.   4. The aortic valve has an indeterminant number of cusps. Aortic valve  regurgitation is not visualized. Aortic valve sclerosis is present, with  no evidence of aortic valve  stenosis.   5. There is borderline dilatation of the ascending aorta, measuring 38  mm.   6. The inferior vena cava is normal in size with greater than 50%  respiratory variability, suggesting right atrial pressure of 3 mmHg.   FINDINGS   Left Ventricle: Left ventricular ejection fraction, by estimation, is 50  to 55%. The left ventricle has low normal function. The left ventricle  demonstrates  regional wall motion abnormalities. Definity  contrast agent  was given IV to delineate the left  ventricular endocardial borders. The left ventricular internal cavity size  was normal in size. There is moderate asymmetric left ventricular  hypertrophy of the septal segment. Left ventricular diastolic parameters  are consistent with Grade I diastolic  dysfunction (impaired relaxation).     LV Wall Scoring:  The apical septal segment, apical inferior segment, and apex are akinetic.  The anterior septum, inferior wall, mid inferoseptal segment, and basal  inferoseptal segment are hypokinetic. The entire anterior wall and entire  lateral wall are normal.   Right Ventricle: The right ventricular size is normal. No increase in  right ventricular wall thickness. Right ventricular systolic function is  normal.   Left Atrium: Left atrial size was normal in size.   Right Atrium: Right atrial size was normal in size.   Pericardium: There is no evidence of pericardial effusion. Presence of  epicardial fat layer.   Mitral Valve: The mitral valve is grossly normal. Trivial mitral valve  regurgitation. MV peak gradient, 7.7 mmHg. The mean mitral valve gradient  is 4.0 mmHg.   Tricuspid Valve: The tricuspid valve is normal in structure. Tricuspid  valve regurgitation is trivial.   Aortic Valve: The aortic valve has an indeterminant number of cusps.  Aortic valve regurgitation is not visualized. Aortic valve sclerosis is  present, with no evidence of aortic valve stenosis.   Pulmonic Valve: The pulmonic valve was grossly normal. Pulmonic valve  regurgitation is not visualized.   Aorta: The aortic root is normal in size and structure. There is  borderline dilatation of the ascending aorta, measuring 38 mm.   Venous: The inferior vena cava is normal in size with greater than 50%  respiratory variability, suggesting right atrial pressure of 3 mmHg.   IAS/Shunts: The interatrial septum was  not well visualized.  _____________   History of Present Illness     Bradley Scott is a 48 y.o. male with hypertension who presented with anterior STEMI from OSH with concern for right femoral artery pseudoaneurysm.   Mr. Turnbough states that the day prior to admission he felt like he had a burning sensation in his chest.  He presented to the emergency room with an anterior STEMI left heart cath demonstrated triple-vessel disease with the LAD is the culprit vessel with 100% stenosis proximally.  High-grade lesions present in the third marginal artery and the distal circumflex about 75% stenosis.  Drug-eluting stents placed in the proximal and mid LAD with resultant TIMI-3 flow.  LVEDP was 40. Mr. Michalski continued to have chest pain at this time. Attempt to place IABP in right femoral artery. However there was difficulty inserting the sheath. A hematoma resulted.  A FemStop was placed and he was transferred over to [facility] for concern for need for higher level care.   Hospital Course     Consultants: [facility]   STEMI -- underwent cardiac cath at [facility] with Dr. Florencio, successful PCI/DES x2 to the mLAD, third OM 75%, distal circumflex 75% to be treated  medically.  Recommendations for DAPT with aspirin /Brilinta  for at least 1 year.  Residual ST elevation due to akinetic apex post MI.  Echocardiogram 1/2 with LVEF of 55%, akinetic apex and inferior septal hypokinesis.  -- Continue aspirin , Brilinta , metoprolol  XL 25 mg daily, losartan  12.5 mg daily  Right SFA pseudoaneurysm Right groin ecchymosis -- Complication from unsuccessful attempt at IABP placement while at [facility] -- Noted on CTA, seen by vascular and underwent right SFA Viabahn and stenting.  Able to ambulate without complications. -- Continue aspirin , Brilinta  -- follow up with [facility] outpatient  Diabetes --Hemoglobin A1c 6.5 --Start metformin  500 mg twice daily, consider Ozempic as an outpatient -- follow up with PCP  outpatient  Hyperlipidemia --LDL 48, HDL 25 --Continue Crestor  20 mg daily --Will need LFT/FLP in 8 weeks  Did the patient have an acute coronary syndrome (MI, NSTEMI, STEMI, etc) this admission?:  Yes                               AHA/ACC ACS Clinical Performance & Quality Measures: Aspirin  prescribed? - Yes ADP Receptor Inhibitor (Plavix/Clopidogrel, Brilinta /Ticagrelor  or Effient/Prasugrel) prescribed (includes medically managed patients)? - Yes Beta Blocker prescribed? - Yes High Intensity Statin (Lipitor 40-80mg  or Crestor  20-40mg ) prescribed? - Yes EF assessed during THIS hospitalization? - Yes For EF <40%, was ACEI/ARB prescribed? - Not Applicable (EF >/= 40%) For EF <40%, Aldosterone Antagonist (Spironolactone or Eplerenone) prescribed? - Not Applicable (EF >/= 40%) Cardiac Rehab Phase II ordered (including medically managed patients)? - Yes   The patient will be scheduled for a TOC follow up appointment in 10-14 days.  A message has been sent to the [facility] and Scheduling Pool at the office where the patient should be seen for follow up.  _____________  Discharge Vitals Blood pressure 131/86, pulse 95, temperature 98.8 F (37.1 C), resp. rate 11, height 5' 10 (1.778 m), weight (!) 145.2 kg, SpO2 96%.  Filed Weights   03/05/23 1338  Weight: (!) 145.2 kg    Labs & Radiologic Studies    CBC Recent Labs    03/04/23 1910 03/05/23 0017 03/05/23 0920 03/06/23 0320  WBC 10.3   < > 11.0* 9.8  NEUTROABS 6.9  --   --   --   HGB 16.8   < > 13.4 12.2*  HCT 49.4   < > 40.4 36.7*  MCV 91.8   < > 93.1 92.9  PLT 182   < > 171 144*   < > = values in this interval not displayed.   Basic Metabolic Panel Recent Labs    98/97/74 0920 03/06/23 0320  NA 136 136  K 4.0 3.7  CL 104 103  CO2 24 22  GLUCOSE 143* 134*  BUN 17 18  CREATININE 1.11 0.92  CALCIUM  8.4* 8.6*  MG  --  2.2   Liver Function Tests Recent Labs    03/04/23 1910  AST 46*  ALT 49*  ALKPHOS 49   BILITOT 1.1  PROT 7.9  ALBUMIN 4.4   No results for input(s): LIPASE, AMYLASE in the last 72 hours. High Sensitivity Troponin:   Recent Labs  Lab 03/04/23 1910  TROPONINIHS 243*    BNP Invalid input(s): POCBNP D-Dimer No results for input(s): DDIMER in the last 72 hours. Hemoglobin A1C Recent Labs    03/04/23 1910  HGBA1C 6.5*   Fasting Lipid Panel Recent Labs    03/04/23 1910  CHOL 198  HDL 25*  LDLCALC 148*  TRIG 127  CHOLHDL 7.9   Thyroid Function Tests No results for input(s): TSH, T4TOTAL, T3FREE, THYROIDAB in the last 72 hours.  Invalid input(s): FREET3 _____________  VAS US  LOWER EXTREMITY ARTERIAL DUPLEX Result Date: 03/05/2023 LOWER EXTREMITY ARTERIAL DUPLEX STUDY Patient Name:  PAUL TRETTIN  Date of Exam:   03/05/2023 Medical Rec #: [id number]       Accession #:    [id number] Date of Birth: June 03, 1975        Patient Gender: M Patient Age:   72 years Exam Location:  [facility] Procedure:      VAS US  LOWER EXTREMITY ARTERIAL DUPLEX Referring Phys: MERLENE OSUDE --------------------------------------------------------------------------------  Indications: Pain, swelling and bruishing right groin. Rule out pseudoaneursym. High Risk Factors: Hypertension, coronary artery disease. Other Factors: Status post heart cath 03/04/23.  Current ABI: NA Limitations: Patient's body habitus. Performing Technologist: Ricka Holland RDMS, RVT  Examination Guidelines: A complete evaluation includes B-mode imaging, spectral Doppler, color Doppler, and power Doppler as needed of all accessible portions of each vessel. Bilateral testing is considered an integral part of a complete examination. Limited examinations for reoccurring indications may be performed as noted.  +----------+--------+-----+--------+---------+--------+ RIGHT     PSV cm/sRatioStenosisWaveform Comments +----------+--------+-----+--------+---------+--------+ CFA Distal67                    triphasic         +----------+--------+-----+--------+---------+--------+ DFA       50                   triphasic         +----------+--------+-----+--------+---------+--------+ SFA Prox  69                   triphasic         +----------+--------+-----+--------+---------+--------+ Technically difficult study due to patient's body habitus.  Summary: Right: Large hematoma visualized in the right groin measuring approximately 7.6 x 6.0 x 3.2 cm. No active pseudoaneurysm visualized. Right common femoral vein not clearly visualized. Patent right mid femoral vein.  See table(s) above for measurements and observations. Electronically signed by Lonni Gaskins MD on 03/05/2023 at 1:47:40 PM.    Final    HYBRID OR IMAGING ([facility] ONLY) Result Date: 03/05/2023 There is no interpretation for this exam.  This order is for images obtained during a surgical procedure.  Please See Surgeries Tab for more information regarding the procedure.   ECHOCARDIOGRAM COMPLETE Result Date: 03/05/2023    ECHOCARDIOGRAM REPORT   Patient Name:   ZHION PEVEHOUSE Date of Exam: 03/05/2023 Medical Rec #:  [id number]      Height:       70.0 in Accession #:    [id number]     Weight:       320.0 lb Date of Birth:  Apr 08, 1975       BSA:          2.549 m Patient Age:    47 years       BP:           113/58 mmHg Patient Gender: M              HR:           96 bpm. Exam Location:  Inpatient Procedure: 2D Echo, Color Doppler, Cardiac Doppler and Intracardiac            Opacification Agent Indications:    Shock  History:  Patient has no prior history of Echocardiogram examinations.  Sonographer:    Damien Senior RDCS Referring Phys: [id number] NKIRU C OSUDE  Sonographer Comments: Technically difficult due to body habitus. IMPRESSIONS  1. Left ventricular ejection fraction, by estimation, is 50 to 55%. The left ventricle has low normal function. The left ventricle demonstrates regional wall motion abnormalities (see scoring diagram/findings  for description). There is moderate asymmetric left ventricular hypertrophy of the septal segment. Left ventricular diastolic parameters are consistent with Grade I diastolic dysfunction (impaired relaxation).  2. Right ventricular systolic function is normal. The right ventricular size is normal.  3. The mitral valve is grossly normal. Trivial mitral valve regurgitation.  4. The aortic valve has an indeterminant number of cusps. Aortic valve regurgitation is not visualized. Aortic valve sclerosis is present, with no evidence of aortic valve stenosis.  5. There is borderline dilatation of the ascending aorta, measuring 38 mm.  6. The inferior vena cava is normal in size with greater than 50% respiratory variability, suggesting right atrial pressure of 3 mmHg. FINDINGS  Left Ventricle: Left ventricular ejection fraction, by estimation, is 50 to 55%. The left ventricle has low normal function. The left ventricle demonstrates regional wall motion abnormalities. Definity  contrast agent was given IV to delineate the left ventricular endocardial borders. The left ventricular internal cavity size was normal in size. There is moderate asymmetric left ventricular hypertrophy of the septal segment. Left ventricular diastolic parameters are consistent with Grade I diastolic dysfunction (impaired relaxation).  LV Wall Scoring: The apical septal segment, apical inferior segment, and apex are akinetic. The anterior septum, inferior wall, mid inferoseptal segment, and basal inferoseptal segment are hypokinetic. The entire anterior wall and entire lateral wall are normal. Right Ventricle: The right ventricular size is normal. No increase in right ventricular wall thickness. Right ventricular systolic function is normal. Left Atrium: Left atrial size was normal in size. Right Atrium: Right atrial size was normal in size. Pericardium: There is no evidence of pericardial effusion. Presence of epicardial fat layer. Mitral Valve: The  mitral valve is grossly normal. Trivial mitral valve regurgitation. MV peak gradient, 7.7 mmHg. The mean mitral valve gradient is 4.0 mmHg. Tricuspid Valve: The tricuspid valve is normal in structure. Tricuspid valve regurgitation is trivial. Aortic Valve: The aortic valve has an indeterminant number of cusps. Aortic valve regurgitation is not visualized. Aortic valve sclerosis is present, with no evidence of aortic valve stenosis. Pulmonic Valve: The pulmonic valve was grossly normal. Pulmonic valve regurgitation is not visualized. Aorta: The aortic root is normal in size and structure. There is borderline dilatation of the ascending aorta, measuring 38 mm. Venous: The inferior vena cava is normal in size with greater than 50% respiratory variability, suggesting right atrial pressure of 3 mmHg. IAS/Shunts: The interatrial septum was not well visualized.  LEFT VENTRICLE PLAX 2D LVIDd:         3.80 cm   Diastology LVIDs:         2.50 cm   LV e' medial:    3.15 cm/s LV PW:         1.00 cm   LV E/e' medial:  18.3 LV IVS:        1.50 cm   LV e' lateral:   5.11 cm/s LVOT diam:     2.20 cm   LV E/e' lateral: 11.3 LV SV:         57 LV SV Index:   23 LVOT Area:     3.80 cm  RIGHT VENTRICLE RV S prime:     14.60 cm/s TAPSE (M-mode): 2.0 cm LEFT ATRIUM             Index        RIGHT ATRIUM           Index LA diam:        2.30 cm 0.90 cm/m   RA Area:     14.70 cm LA Vol (A2C):   31.7 ml 12.44 ml/m  RA Volume:   35.00 ml  13.73 ml/m LA Vol (A4C):   88.0 ml 34.53 ml/m LA Biplane Vol: 58.5 ml 22.95 ml/m  AORTIC VALVE LVOT Vmax:   83.00 cm/s LVOT Vmean:  60.800 cm/s LVOT VTI:    0.151 m  AORTA Ao Root diam: 3.30 cm Ao Asc diam:  3.80 cm MITRAL VALVE MV Area (PHT): 2.91 cm     SHUNTS MV Area VTI:   2.38 cm     Systemic VTI:  0.15 m MV Peak grad:  7.7 mmHg     Systemic Diam: 2.20 cm MV Mean grad:  4.0 mmHg MV Vmax:       1.39 m/s MV Vmean:      95.7 cm/s MV Decel Time: 261 msec MV E velocity: 57.60 cm/s MV A velocity:  107.00 cm/s MV E/A ratio:  0.54 Aditya Sabharwal Electronically signed by Ria Commander Signature Date/Time: 03/05/2023/12:54:04 PM    Final    CARDIAC CATHETERIZATION Result Date: 03/05/2023   Prox LAD to Mid LAD lesion is 100% stenosed.   Mid LAD lesion is 75% stenosed.   Dist LAD lesion is 80% stenosed.   3rd Mrg lesion is 75% stenosed.   Dist Cx lesion is 75% stenosed.   Ost LAD to Prox LAD lesion is 25% stenosed.   A drug-eluting stent was successfully placed using a STENT ONYX FRONTIER 2.5X34.   A drug-eluting stent was successfully placed using a STENT ONYX FRONTIER 3.0X30.   Post intervention, there is a 0% residual stenosis.   Post intervention, there is a 0% residual stenosis.   There is moderate to severe left ventricular systolic dysfunction.   LV end diastolic pressure is severely elevated.   The left ventricular ejection fraction is 35-45% by visual estimate.   Anticipated discharge date to be determined.   Recommend uninterrupted dual antiplatelet therapy with Aspirin  81mg  daily and Ticagrelor  90mg  twice daily for a minimum of 12 months (ACS-Class I recommendation)   LVEDP 40 MeanPA  26 , Mean wedge 11 Conclusion Anterior STEMI occluded mid LAD TIMI 0 flow Right radial approach Left main large free of disease LAD 100% mid LAD IRA TIMI 0 flow Circumflex large with minor irregularities and mild distal disease RCA minor irregularities Intervention Partial successful PCI and stent of 2 frontier Onyx overlapping mid to distal LAD 3.0 x 30 mm proximal and 2.5 x 34 mm mid to distal with TIMI II flow to the distal LAD TIMI-3 flow was never fully restored Evidence of no reflow Patient received escalating doses of adenosine  72 and then 96 as well as 200 mg of nitroglycerin  Unsuccessful attempt at intra-aortic balloon pump unable to place the sheath because of tortuosity Patient developed a moderate-sized hematoma which was held and reduced manually .  FemoStop was eventually placed to help with hemostasis  Right IJ Norva was placed to measure pulmonary pressures Mean wedge was 11 Mean PA was 26 Patient was maintained on Aggrastat  Transferred to [facility] under Dr. Patwardan   CT  ANGIO LOWER EXT BILAT W &/OR WO CONTRAST Result Date: 03/05/2023 CLINICAL DATA:  Recent heart catheterization, possible right femoral pseudoaneurysm EXAM: CT ANGIOGRAPHY OF ABDOMINAL AORTA WITH ILIOFEMORAL RUNOFF TECHNIQUE: Multidetector CT imaging of the abdomen, pelvis and lower extremities was performed using the standard protocol during bolus administration of intravenous contrast. Multiplanar CT image reconstructions and MIPs were obtained to evaluate the vascular anatomy. RADIATION DOSE REDUCTION: This exam was performed according to the departmental dose-optimization program which includes automated exposure control, adjustment of the mA and/or kV according to patient size and/or use of iterative reconstruction technique. CONTRAST:  OMNIPAQUE  IOHEXOL  350 MG/ML SOLN COMPARISON:  None Available. FINDINGS: VASCULAR Aorta: Abdominal aorta is within normal limits. Celiac: Not included on this exam SMA: Not included on this exam Renals: This exam IMA: Patent without evidence of aneurysm, dissection, vasculitis or significant stenosis. RIGHT Lower Extremity Inflow: Common and external iliac artery are widely patent. Runoff: Common femoral artery is well visualized. Changes consistent with the recent heart catheterization with local subcutaneous hemorrhage is seen. This is consistent with the patient's given clinical history. No discrete hematoma is seen as the hemorrhage interdigitates in the subcutaneous tissues. The overall area of hemorrhage measures approximately 15 x 9 cm in greatest dimension. There is a collection of extraluminal contrast identified adjacent to the proximal superficial femoral artery. Delayed images demonstrate some increase in opacification. This raises suspicion for a pseudoaneurysm although a true neck is not  visualized on this exam. Also this likely represents a superficial femoral puncture as opposed to a common femoral arterial puncture correlate with intraoperative findings. Superficial femoral artery distally is within normal limits as is the popliteal artery. Popliteal trifurcation is patent. The distal runoff vessels are somewhat difficult to evaluate due to poor cardiac output. The posterior tibial artery is noted in the calf. LEFT Lower Extremity Inflow: Common and external iliac arteries on the left are within normal limits. Common femoral artery is within normal limits. Runoff: Common femoral bifurcation is patent. Superficial femoral artery and popliteal artery are widely patent as well. Popliteal trifurcation appears patent although due to poor cardiac output visualization is somewhat limited. Posterior tibial artery is noted to the level of the left ankle. Veins: No specific venous abnormality is noted. Review of the MIP images confirms the above findings. NON-VASCULAR Hepatobiliary: Not included on this exam Pancreas: Not included on this exam Spleen: Included on this exam Adrenals/Urinary Tract: Visualized portion of the left kidney is within normal limits. The bladder is well distended with opacified urine. Stomach/Bowel: Visualized bowel shows no acute abnormality. The appendix is within normal limits. Lymphatic: No significant lymphadenopathy is noted. Reproductive: Prostate is unremarkable. Other: No free fluid is noted within the pelvis. Fat containing umbilical hernia is noted. Musculoskeletal: No acute bony abnormality is noted. IMPRESSION: VASCULAR Changes consistent with extensive hemorrhage in the subcutaneous tissues as described. A discrete focal hematoma is not appreciated as the hemorrhage interdigitates in the subcutaneous tissues. Changes consistent with a pseudoaneurysm adjacent to the proximal superficial femoral artery. The actual size of the aneurysm is difficult to assess due to  adjacent hemorrhage. Ultrasound may be helpful in this regard. Additionally the pseudoaneurysm appears to arise from the proximal superficial femoral artery. No significant occlusive disease is noted although the infrapopliteal vessels are somewhat difficult to evaluate due to poor cardiac output. NON-VASCULAR No acute abnormality is noted. Electronically Signed   By: Oneil Devonshire M.D.   On: 03/05/2023 01:48   DG CHEST PORT 1 VIEW  Result Date: 03/05/2023 CLINICAL DATA:  ST-elevation MI.  Central line placement. EXAM: PORTABLE CHEST 1 VIEW COMPARISON:  None Available. FINDINGS: Right internal jugular Swan-Ganz catheter tip in the region of the right pulmonary outflow tract. Lung volumes are low. The heart is upper normal in size. Mediastinal contours are normal. Bronchovascular crowding versus vascular congestion. No pneumothorax or pleural effusion. No acute osseous findings. IMPRESSION: 1. Right internal jugular Swan-Ganz catheter tip in the region of the right pulmonary outflow tract. 2. Low lung volumes with bronchovascular crowding versus vascular congestion. Electronically Signed   By: Andrea Gasman M.D.   On: 03/05/2023 00:39   Disposition   Pt is being discharged home today in good condition.  Follow-up Plans & Appointments     Follow-up Information     [facility] at [facility] Follow up in 5 week(s).   Specialty: Vascular Surgery Contact information: [address] [place]  [address] [phone]               Discharge Instructions     Amb Referral to Cardiac Rehabilitation   Complete by: As directed    Diagnosis:  STEMI Coronary Stents     After initial evaluation and assessments completed: Virtual Based Care may be provided alone or in conjunction with Phase 2 Cardiac Rehab based on patient barriers.: Yes   Intensive Cardiac Rehabilitation (ICR) [facility] location only OR Traditional Cardiac Rehabilitation (TCR) *If criteria for  ICR are not met will enroll in TCR [facility] only): Yes   Call MD for:  difficulty breathing, headache or visual disturbances   Complete by: As directed    Call MD for:  persistant dizziness or light-headedness   Complete by: As directed    Call MD for:  redness, tenderness, or signs of infection (pain, swelling, redness, odor or green/yellow discharge around incision site)   Complete by: As directed    Diet - low sodium heart healthy   Complete by: As directed    Discharge instructions   Complete by: As directed    Groin Site Care Refer to this sheet in the next few weeks. These instructions provide you with information on caring for yourself after your procedure. Your caregiver may also give you more specific instructions. Your treatment has been planned according to current medical practices, but problems sometimes occur. Call your caregiver if you have any problems or questions after your procedure. HOME CARE INSTRUCTIONS You may shower 24 hours after the procedure. Remove the bandage (dressing) and gently wash the site with plain soap and water . Gently pat the site dry.  Do not apply powder or lotion to the site.  Do not sit in a bathtub, swimming pool, or whirlpool for 5 to 7 days.  No bending, squatting, or lifting anything over 10 pounds (4.5 kg) as directed by your caregiver.  Inspect the site at least twice daily.  Do not drive home if you are discharged the same day of the procedure. Have someone else drive you.  You may drive 24 hours after the procedure unless otherwise instructed by your caregiver.  What to expect: Any bruising will usually fade within 1 to 2 weeks.  Blood that collects in the tissue (hematoma) may be painful to the touch. It should usually decrease in size and tenderness within 1 to 2 weeks.  SEEK IMMEDIATE MEDICAL CARE IF: You have unusual pain at the groin site or down the affected leg.  You have redness, warmth, swelling, or pain  at the groin site.  You have  drainage (other than a small amount of blood on the dressing).  You have chills.  You have a fever or persistent symptoms for more than 72 hours.  You have a fever and your symptoms suddenly get worse.  Your leg becomes pale, cool, tingly, or numb.  You have heavy bleeding from the site. Hold pressure on the site. SABRA  PLEASE DO NOT MISS ANY DOSES OF YOUR BRILINTA !!!!! Also keep a log of you blood pressures and bring back to your follow up appt. Please call the office with any questions.   Patients taking blood thinners should generally stay away from medicines like ibuprofen, Advil, Motrin, naproxen, and Aleve due to risk of stomach bleeding. You may take Tylenol  as directed or talk to your primary doctor about alternatives.   PLEASE ENSURE THAT YOU DO NOT RUN OUT OF YOUR BRILINTA . This medication is very important to remain on for at least one year. IF you have issues obtaining this medication due to cost please CALL the office 3-5 business days prior to running out in order to prevent missing doses of this medication.   Increase activity slowly   Complete by: As directed        Discharge Medications   Allergies as of 03/06/2023   No Known Allergies      Medication List     STOP taking these medications    aspirin  EC 81 MG tablet Replaced by: aspirin  81 MG chewable tablet       TAKE these medications    aspirin  81 MG chewable tablet Chew 1 tablet (81 mg total) by mouth daily. Start taking on: March 07, 2023 Replaces: aspirin  EC 81 MG tablet   losartan  25 MG tablet Commonly known as: COZAAR  Take 0.5 tablets (12.5 mg total) by mouth daily. Start taking on: March 07, 2023   metFORMIN  500 MG tablet Commonly known as: GLUCOPHAGE  Take 1 tablet (500 mg total) by mouth 2 (two) times daily with a meal.   metoprolol  succinate 25 MG 24 hr tablet Commonly known as: TOPROL -XL Take 0.5 tablets (12.5 mg total) by mouth daily. Start taking on: March 07, 2023    nitroGLYCERIN  0.4 MG SL tablet Commonly known as: NITROSTAT  Place 1 tablet (0.4 mg total) under the tongue every 5 (five) minutes as needed.   rosuvastatin  20 MG tablet Commonly known as: CRESTOR  Take 1 tablet (20 mg total) by mouth daily. Start taking on: March 07, 2023   ticagrelor  90 MG Tabs tablet Commonly known as: BRILINTA  Take 1 tablet (90 mg total) by mouth 2 (two) times daily.          Outstanding Labs/Studies   FLP/LFTs in 8 weeks  BMET/CBC at follow up appt   Duration of Discharge Encounter   Greater than 30 minutes including physician time.  Signed, Manuelita Rummer, NP 03/06/2023, 2:10 PM

## 2023-03-06 NOTE — TOC Initial Note (Signed)
 Transition of Care [facility]) - Initial/Assessment Note    Patient Details  Name: Bradley Scott MRN: [id number] Date of Birth: [birth year]  Transition of Care [facility] CM/SW Contact:    Justina Delcia Czar, RN Phone Number: [phone] 03/06/2023, 2:34 PM  Clinical Narrative:                  PCP appt scheduled and placed on AVS. Provided pt with work note. Wife states they are going to hold off on FMLA for now. He will use his sick time. Has copay card for Brilinta .     Expected Discharge Plan: Home/Self Care Barriers to Discharge: No Barriers Identified   Patient Goals and CMS Choice Patient states their goals for this hospitalization and ongoing recovery are:: patient wants to recover fully          Expected Discharge Plan and Services   Discharge Planning Services: CM Consult   Living arrangements for the past 2 months: Single Family Home Expected Discharge Date: 03/06/23                                    Prior Living Arrangements/Services Living arrangements for the past 2 months: Single Family Home Lives with:: Spouse Patient language and need for interpreter reviewed:: Yes Do you feel safe going back to the place where you live?: Yes      Need for Family Participation in Patient Care: No (Comment) Care giver support system in place?: Yes (comment)   Criminal Activity/Legal Involvement Pertinent to Current Situation/Hospitalization: No - Comment as needed  Activities of Daily Living   ADL Screening (condition at time of admission) Independently performs ADLs?: Yes (appropriate for developmental age) Is the patient deaf or have difficulty hearing?: No Does the patient have difficulty seeing, even when wearing glasses/contacts?: No Does the patient have difficulty concentrating, remembering, or making decisions?: No  Permission Sought/Granted Permission sought to share information with : Case Manager, Family Supports Permission granted to share information  with : Yes, Verbal Permission Granted  Share Information with NAME: Bradley Scott  Permission granted to share info w AGENCY: PCP  Permission granted to share info w Relationship: wife  Permission granted to share info w Contact Information: [phone]  Emotional Assessment Appearance:: Appears stated age Attitude/Demeanor/Rapport: Engaged Affect (typically observed): Accepting Orientation: : Oriented to Self, Oriented to Place, Oriented to  Time, Oriented to Situation   Psych Involvement: No (comment)  Admission diagnosis:  STEMI (ST elevation myocardial infarction) [facility]) [I21.3] Patient Active Problem List   Diagnosis Date Noted   Type 2 diabetes mellitus with complication, without long-term current use of insulin (HCC) 03/06/2023   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 [place] [address] Phone: [phone] Fax: [phone]  [facility] [address] [place] [address] Phone: [phone] Fax: [phone]     Social Drivers of Health (SDOH) Social History: SDOH Screenings   Food Insecurity: No Food Insecurity (03/05/2023)  Housing: Low Risk  (03/05/2023)  Transportation Needs: No Transportation Needs (03/05/2023)  Utilities:  Not At Risk (03/05/2023)  Alcohol Screen: Low Risk  (10/02/2017)  Tobacco Use: High Risk (03/05/2023)   SDOH Interventions:     Readmission Risk Interventions     No data to display

## 2023-03-06 NOTE — Progress Notes (Signed)
 CARDIAC REHAB PHASE I   PRE:  Rate/Rhythm: 89 SR   BP:  Sitting: 131/86     MODE:  Ambulation: 125 ft   POST:  Rate/Rhythm: 101 ST   BP:  Sitting: 134/90      Pt ambulated independently in hallway. Tolerated well with no CP, SOB or dizziness. Returned to bed with call bella and bedside table in reach. Post MI/stent education including restrictions, risk factors, exercise guidelines, antiplatelet therapy importance, MI booklet, NTG use, heart healthy diet, smoking cessation and CRP2 reviewed. All questions and concerns addressed. Will refer to [facility] for CRP2. Plan for home later today.   [id number] Bradley Asberry Hacking, RN BSN 03/06/2023 1:47 PM

## 2023-03-06 NOTE — Discharge Instructions (Signed)

## 2023-03-06 NOTE — Progress Notes (Signed)
 Patient Name: Bradley Scott Date of Encounter: 03/06/2023 [facility] Cardiologist: None   Interval Summary  .    Sleep ES morning No chest pain Groin pain has resolved after SFA stenting yesterday  Vital Signs .    Vitals:   03/06/23 0400 03/06/23 0500 03/06/23 0600 03/06/23 0700  BP: 115/65 106/77 (!) 109/91 (!) 121/90  Pulse: (!) 101 98 99 97  Resp: (!) 24 19 14 13   Temp: 98.8 F (37.1 C) 99.1 F (37.3 C) 99 F (37.2 C) 98.8 F (37.1 C)  TempSrc: Core     SpO2: 96% 97% 96% 98%  Weight:      Height:        Intake/Output Summary (Last 24 hours) at 03/06/2023 0754 Last data filed at 03/06/2023 0400 Gross per 24 hour  Intake 1059.8 ml  Output 1510 ml  Net -450.2 ml      03/05/2023    1:38 PM 03/04/2023    6:57 PM 10/02/2017   10:18 AM  Last 3 Weights  Weight (lbs) 320 lb 320 lb 322 lb  Weight (kg) 145.151 kg 145.151 kg 146.058 kg      Telemetry/ECG     - Personally Reviewed Telemetry 03/06/2023: No significant arrhythmia  Peripheral intervention 03/05/2023: Viabahn stenting to right SFA 8 mm x 5 cm  Independently interpreted Echocardiogram 03/05/2023: Akinetic apex, hypokinesis of inferoseptum, rest of the wall motion normal. EF 50-55%.  Grade 1 diastolic dysfunction. No significant valvular abnormality.  Independently interpreted Coronary intervention 03/04/2023: Mid LAD occlusion treated with 2 overlapping stents Onyx 3.0 x 30, and 2.5 x 34 mm Proximal LAD 30-40% disease Proximal OM 370% disease.  Physical Exam .   Physical Exam Vitals and nursing note reviewed.  Constitutional:      General: He is not in acute distress. Neck:     Vascular: No JVD.  Cardiovascular:     Rate and Rhythm: Normal rate and regular rhythm.     Heart sounds: Normal heart sounds. No murmur heard. Pulmonary:     Effort: Pulmonary effort is normal.     Breath sounds: Normal breath sounds. No wheezing or rales.  Musculoskeletal:     Right lower leg: No edema.      Left lower leg: No edema.  Skin:    Comments: Right groin ecchymosis No obvious hematoma      Assessment & Plan .     48 year old male with hypertension, obesity, anterior STEMI s/p primary PCI to mid LAD with 2 overlapping stents (Dr. 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 Culprit mid LAD, treated with 2 overlapping stents. Moderate OM3-medical treatment for now. Continue DAPT with aspirin  and Brilinta  at least till 03/2024. Residual ST elevation due to akinetic apex post MI. LDL 148, previously not on statin.  Continue Crestor  20 mg daily. Check lipoprotein a today. Formal echocardiogram on 03/05/2023 showed EF of 50-55% with akinetic apex and inferoseptal hypokinesis. EF is remarkably better than that noted on LV gram on 03/04/2023. Hemodynamically stable, not in shock. Residual wall motion abnormality. Will add metoprolol  succinate 12.5 mg daily  Given new diagnosis of borderline diabetes, add losartan  12.5 mg daily. Discontinue Swan catheter.  Right SFA pseudoaneurysm: Complication from unsuccessful access attempt for IABP placement. Noted on CTA, surprisingly not seen on ultrasound. Patient underwent right SFA Viabahn stenting by vascular surgery  Continue DAPT.  Right groin ecchymosis:  No discernible hematoma.    Type II DM: Hemoglobin A1c 6.5%, new diagnosis Will metformin  500 mg bid. Could consider Ozempic outpatient given CAD, DM, and morbid obesity  Will start losartan , metoprolol , metformin  this morning.  Will ambulate later today.  If he does well with ambulation with no chest pain, shortness of breath, significant groin pain, patient could potentially be discharged home later this afternoon.  Will arrange the following medications medications from [facility] pharmacy: Aspirin  81 mg daily Brilinta  90 mg twice daily Crestor  10 mg daily Metoprolol  succinate  12.5 mg daily Losartan  12.5 mg daily Metformin  5 mg twice daily Sublingual nitroglycerin  as needed  Patient and wife categorically stated that they would like to come to [facility] for follow-up. Will arrange TOC follow-up in next 2 weeks.  For questions or updates, please contact [facility] Please consult [email or web address] for contact info under        Signed, Newman JINNY Lawrence, MD

## 2023-03-06 NOTE — Progress Notes (Signed)
 Discharge instructions given to patient and spouse. They verbalize understanding. Patient with no complaints at this time. Will d/c via WC.

## 2023-03-06 NOTE — TOC Benefit Eligibility Note (Signed)
 Pharmacy Patient Advocate Encounter  Insurance verification completed.   The patient is insured through [employer] [place]     Ran test claim for Brilinta . Currently a quantity of 60 is a 30 day supply and the co-pay is $50.00 .  Ran test claim for Farxiga. Currently a quantity of 30 is a 30 day supply and the co-pay is $50.00 .  This test claim was processed through [facility] copay amounts may vary at other pharmacies due to pharmacy/plan contracts, or as the patient moves through the different stages of their insurance plan.

## 2023-03-06 NOTE — Progress Notes (Addendum)
  Progress Note    03/06/2023 8:14 AM 1 Day Post-Op  Subjective:  pain R thigh   Vitals:   03/06/23 0600 03/06/23 0700  BP: (!) 109/91 (!) 121/90  Pulse: 99 97  Resp: 14 13  Temp: 99 F (37.2 C) 98.8 F (37.1 C)  SpO2: 96% 98%   Physical Exam: Lungs:  non labored Incisions:  L groin c/d/I; R thigh tender to touch Extremities:  palpable R PT Neurologic: A&O  CBC    Component Value Date/Time   WBC 9.8 03/06/2023 0320   RBC 3.95 (L) 03/06/2023 0320   HGB 12.2 (L) 03/06/2023 0320   HCT 36.7 (L) 03/06/2023 0320   PLT 144 (L) 03/06/2023 0320   MCV 92.9 03/06/2023 0320   MCH 30.9 03/06/2023 0320   MCHC 33.2 03/06/2023 0320   RDW 13.5 03/06/2023 0320   LYMPHSABS 2.6 03/04/2023 1910   MONOABS 0.5 03/04/2023 1910   EOSABS 0.1 03/04/2023 1910   BASOSABS 0.1 03/04/2023 1910    BMET    Component Value Date/Time   NA 136 03/06/2023 0320   K 3.7 03/06/2023 0320   CL 103 03/06/2023 0320   CO2 22 03/06/2023 0320   GLUCOSE 134 (H) 03/06/2023 0320   BUN 18 03/06/2023 0320   CREATININE 0.92 03/06/2023 0320   CALCIUM  8.6 (L) 03/06/2023 0320   GFRNONAA >60 03/06/2023 0320    INR    Component Value Date/Time   INR 1.0 03/04/2023 1910     Intake/Output Summary (Last 24 hours) at 03/06/2023 9185 Last data filed at 03/06/2023 0400 Gross per 24 hour  Intake 914.6 ml  Output 1510 ml  Net -595.4 ml     Assessment/Plan:  48 y.o. male is s/p R SFA viabahn stent placement to repair access site 1 Day Post-Op   BLE well perfused Subjectively, thigh is feeling better; thigh is also soft to palpation Would consider checking duplex to confirm pseudoaneurysm sealed if H&H continues to drift; we will otherwise arrange an office duplex in 4-6 weeks    Bradley Sender, PA-C [facility] [phone] 03/06/2023 8:14 AM  VASCULAR STAFF ADDENDUM: I have independently interviewed and examined the patient. I agree with the above.  Plan for 4-6 week follow up  with RLE duplex  Bradley GORMAN Serve MD [facility] Phone Number: [phone] 03/06/2023 2:22 PM

## 2023-03-07 LAB — LIPOPROTEIN A (LPA): Lipoprotein (a): 43.7 nmol/L — ABNORMAL HIGH (ref <75.0)

## 2023-03-10 NOTE — Telephone Encounter (Signed)
 Left a message to call back.

## 2023-03-17 NOTE — Discharge Summary (Signed)
   Physician Discharge Summary      Patient ID: Bradley Scott MRN: [id number] DOB/AGE: January 05, 1976 48 y.o.  Admit date: 03/04/2023 Discharge date: 03/17/2023  Primary Discharge Diagnosis anterior STEMI Secondary Discharge Diagnosis myocardial infarction heart failure right groin hematoma  Significant Diagnostic Studies: Cardiac cath PCI and stent and yes  Consults: None  Hospital Course: Patient presented to emergency room with anterior STEMI patient is a smoker hypertensive obese no significant cardiac workup EKG had ST elevation anteriorly 9 out of 10 pain tachycardic patient was taken to the Cath Lab underwent PCI and stent 100% occluded proximal LAD and a second stent was placed distal to that because the patient had less than TIMI-3 flow.  Patient had elevated end-diastolic pressures we tried to place a balloon pump was unsuccessful the wire would not track patient had up with a significant hematoma.  Patient was then transferred to [facility] for further management   Discharge Exam: Blood pressure (!) 137/123, pulse (!) 101, temperature 98.4 F (36.9 C), temperature source Oral, resp. rate (!) 21, height 5' 10 (1.778 m), weight (!) 145.2 kg, SpO2 94%.   General appearance: appears stated age Neck: no adenopathy, no carotid bruit, no JVD, supple, symmetrical, trachea midline, and thyroid not enlarged, symmetric, no tenderness/mass/nodules Resp: clear to auscultation bilaterally Cardio: regular rate and rhythm, S1, S2 normal, no murmur, click, rub or gallop GI: soft, non-tender; bowel sounds normal; no masses,  no organomegaly Extremities: extremities normal, atraumatic, no cyanosis or edema Pulses: 2+ and symmetric Labs:   Lab Results  Component Value Date   WBC 9.8 03/06/2023   HGB 12.2 (L) 03/06/2023   HCT 36.7 (L) 03/06/2023   MCV 92.9 03/06/2023   PLT 144 (L) 03/06/2023   No results for input(s): NA, K, CL, CO2, BUN, CREATININE, CALCIUM , PROT, BILITOT,  ALKPHOS, ALT, AST, GLUCOSE in the last 168 hours.  Invalid input(s): LABALBU    Radiology:  EKG: Normal sinus rhythm ST elevation anteriorly rate of 110  FOLLOW UP PLANS AND APPOINTMENTS Discharge Instructions     AMB Referral to Cardiac Rehabilitation - Phase II   Complete by: As directed    Diagnosis: STEMI   After initial evaluation and assessments completed: Virtual Based Care may be provided alone or in conjunction with Phase 2 Cardiac Rehab based on patient barriers.: Yes      Allergies as of 03/04/2023   No Known Allergies      Medication List    You have not been prescribed any medications.      BRING ALL MEDICATIONS WITH YOU TO FOLLOW UP APPOINTMENTS  Time spent with patient to include physician time: 90 minutes Signed:  Cara JONETTA Lovelace MD 03/17/2023, 5:11 PM

## 2023-03-18 NOTE — Progress Notes (Signed)
Cardiology Office Note:  .   Date:  03/19/2023  ID:  Bradley Scott, DOB [birth year], MRN [id number] PCP: Bradley Downer, MD  [facility] Providers Cardiologist:  Bradley Negus, MD    Patient Profile: .      PMH Coronary artery disease S/p anterior STEMI 03/04/2023 LHC 03/04/23 Prox to mid LAD 100% mLAD 75 dLAD 80 3rd Mrg lesion 75 Dist Cx 75 oLAD to pLAD 25 Partial successful PCI and stent overlapping mid to distal LAD 3.0 x 30 mm proximal and 2.5 x 34 mm mid to distal  TIMI-3 flow never fully restored, evidence of no reflow Unsuccessful attempt at intra-aortic balloon pump >>transferred to [facility] C/b right femoral artery pseudoaneurysm S/p right SFA Viabahn Hypertension Hyperlipidemia Type 2 DM Morbid obesity  He presented with chest pain to [facility] on 03/04/2023.  He had abnormal EKG with ST elevation in anterior and related midsternal chest pain 9/10.  He did not have accompanying symptoms of nausea, vomiting, diarrhea, or diaphoresis.  No radiation of pain.  He was taken to the Cath Lab which demonstrated triple-vessel disease with LAD culprit lesion 100% stenosis proximally.  High-grade lesions present in the third marginal artery and distal circumflex about 75% stenosis.  Drug-eluting stents placed in the proximal and mid LAD with resultant TIMI-3 flow.  LVEDP was 40.  He continued to have chest pain at that time.  Attempted to place IABP in right femoral artery, however there was difficulty in starting the sheath and a hematoma resulted.  FemStop was placed and he was transferred to [facility] for concern for higher level of care.  Upon arrival, he had soft BP.  Antihypertensives and diuresis were held.  Due to the large femoral hematoma, CT was obtained which showed active extravasation.  Vascular surgery was consulted.  LDL was 148 and he was started on rosuvastatin 20 mg daily.  Echocardiogram obtained 03/05/2023 showed EF 50 to 55% with akinetic apex and inferior  septal hypokinesis.  He was hemodynamically stable.  Metoprolol succinate 12.5 mg daily and losartan 12.5 mg daily were added.  He underwent right SFA Viabahn stenting by vascular surgery. DAPT was continued with aspirin and Brilinta. New diagnosis of Type II DM with A1C of 6.5%.  Metformin was started       History of Present Illness: .   Bradley Scott is a very pleasant 48 y.o. male who is here today with his wife for hospital follow-up. He reports he is feeling well. He continues to have right leg bruising but he thinks it is improving. He had significant pain in the leg following the catheterization and a brief episode of chest and leg pain yesterday which resolved quickly. He denies any other chest pain or lightheadedness and reports feeling better than he has in a long time.  Admits he had a lot of chest heaviness in the past which he ignored.  He would feel so dizzy at times that he would have to pull over his car. He acknowledges the need for lifestyle changes, including quitting smoking and improving his diet. He has already cut down on smoking and stopped drinking Monsters. He was drinking more than 4 Monsters a day "it was all I drank, literally."  Has decreased smoking but would like to quit. Has been eating more chicken and fish but struggles with incorporating vegetables into his diet. He has a history of successful weight loss through the keto diet but acknowledges the need for sustainable  changes. 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 He is due to return to work soon and says he cannot afford to stay out too long, but is concerned about managing stress appropriately.  He denies palpitations, orthopnea, PND, presyncope, syncope.  Discussed the use of AI scribe software for clinical note transcription with the patient, who gave verbal consent to proceed.   ROS: See HPI       Studies Reviewed: Marland Kitchen   EKG  Interpretation Date/Time:  Thursday March 19 2023 14:38:32 EST Ventricular Rate:  97 PR Interval:  166 QRS Duration:  102 QT Interval:  332 QTC Calculation: 421 R Axis:   55  Text Interpretation: Normal sinus rhythm Low voltage QRS Cannot rule out Anteroseptal infarct (cited on or before 05-Mar-2023) When compared with ECG of 05-Mar-2023 16:44, Serial changes of evolving Anteroseptal infarct Present Confirmed by Bradley Scott [phone]) on 03/19/2023 3:05:02 PM     Risk Assessment/Calculations:             Physical Exam:   VS:  BP 124/82 (BP Location: Left Arm, Patient Position: Sitting, Cuff Size: Large)   Pulse 83   Ht 5\' 10"  (1.778 m)   Wt (!) 334 lb 12.8 oz (151.9 kg)   SpO2 97%   BMI 48.04 kg/m    Wt Readings from Last 3 Encounters:  03/19/23 (!) 334 lb 12.8 oz (151.9 kg)  03/05/23 (!) 320 lb (145.2 kg)  03/04/23 (!) 320 lb (145.2 kg)    GEN: Obese, well developed in no acute distress NECK: No JVD; No carotid bruits CARDIAC: RRR, no murmurs, rubs, gallops RESPIRATORY:  Clear to auscultation without rales, wheezing or rhonchi  ABDOMEN: Soft, non-tender, non-distended EXTREMITIES:  No edema; No deformity     ASSESSMENT AND PLAN: .    CAD without angina: S/p acute anterior STEMI 03/04/23 with culprit lesion 100% occlusion treated with overlapping DES to mid LAD. He did not have adequate improvement in flow post stenting and balloon pump was attempted through right femoral artery. He was transferred from [facility] to [facility] for management.  He reports only one brief episode of chest pain since hospital discharge. He has not been very active, but plans to return to work next week. He will avoid heavy lifting for another two weeks.  He is interested in cardiac rehab but is not certain whether it will be feasible with his work schedule.  No problems with current medications.  No bleeding concerns.  We will continue GDMT including aspirin, Brilinta, losartan, metoprolol, rosuvastatin. He  also has SL ntg to use as needed, has not taken any.   Right femoral hematoma: S/p right SFA stenting. He reports bruising is improving. No pain today. Keep follow-up with vascular surgery.   Hyperlipidemia LDL goal < 70: LP(a) not elevated. Lipid panel completed 03/04/2023 with total cholesterol 198, HDL 25, LDL 148, and triglycerides 127.  With a lengthy discussion about heart healthy diet and regular exercise.  Handouts provided and he was encouraged to consider small changes at first that are consistent with lifestyle modification rather than crash dieting.  We will recheck lipid and liver panel in 2 months.  Hypertension: BP is well controlled.  Advised him to monitor at home on a routine basis and report concerns to [place]. No medication changes today.  Continue losartan and metoprolol.  Type 2 DM: A1c 6.5 on 03/04/2023.  No prior diagnosis of diabetes.  He was started  on metformin 500 mg twice daily.  Advised further management per PCP. He has an appointment with her on 03/20/23.   Tobacco abuse: Has reduced number of cigs. Is motivated to quit.  Prescription for Chantix provided along with encouragement to report any adverse side effects.  Complete cessation advised.  Obesity: He reports long history of difficulty with sustained weight loss.  His father is > 500 lb. we had a lengthy discussion about healthy weight loss through diet and exercise.  He asks about GLP 1 agonist.  I advised this is an option but encouraged him to attempt weight loss on his own prior to pursuing GLP-1 agonist.     Cardiac Rehabilitation Eligibility Assessment         Dispo: 3 months with Dr. Rosemary Holms  Signed, Bradley Bridegroom, NP-C

## 2023-03-19 ENCOUNTER — Encounter: Payer: Self-pay | Admitting: Nurse Practitioner

## 2023-03-19 ENCOUNTER — Ambulatory Visit: Payer: BC Managed Care – PPO | Attending: Nurse Practitioner | Admitting: Nurse Practitioner

## 2023-03-19 ENCOUNTER — Other Ambulatory Visit: Payer: Self-pay | Admitting: *Deleted

## 2023-03-19 VITALS — BP 124/82 | HR 83 | Ht 70.0 in | Wt 334.8 lb

## 2023-03-19 DIAGNOSIS — Z6841 Body Mass Index (BMI) 40.0 and over, adult: Secondary | ICD-10-CM

## 2023-03-19 DIAGNOSIS — E785 Hyperlipidemia, unspecified: Secondary | ICD-10-CM | POA: Diagnosis not present

## 2023-03-19 DIAGNOSIS — I2510 Atherosclerotic heart disease of native coronary artery without angina pectoris: Secondary | ICD-10-CM | POA: Diagnosis not present

## 2023-03-19 DIAGNOSIS — I213 ST elevation (STEMI) myocardial infarction of unspecified site: Secondary | ICD-10-CM

## 2023-03-19 DIAGNOSIS — I10 Essential (primary) hypertension: Secondary | ICD-10-CM

## 2023-03-19 DIAGNOSIS — E66813 Obesity, class 3: Secondary | ICD-10-CM | POA: Diagnosis not present

## 2023-03-19 DIAGNOSIS — Z720 Tobacco use: Secondary | ICD-10-CM

## 2023-03-19 DIAGNOSIS — I724 Aneurysm of artery of lower extremity: Secondary | ICD-10-CM

## 2023-03-19 DIAGNOSIS — E118 Type 2 diabetes mellitus with unspecified complications: Secondary | ICD-10-CM

## 2023-03-19 MED ORDER — VARENICLINE TARTRATE 1 MG PO TABS: ORAL_TABLET | ORAL | 3 refills | Status: AC

## 2023-03-19 MED ORDER — LOSARTAN POTASSIUM 25 MG PO TABS: 12.5000 mg | ORAL_TABLET | Freq: Every day | ORAL | 3 refills | Status: DC

## 2023-03-19 MED ORDER — METOPROLOL SUCCINATE ER 25 MG PO TB24: 12.5000 mg | ORAL_TABLET | Freq: Every day | ORAL | 3 refills | Status: DC

## 2023-03-19 MED ORDER — VARENICLINE TARTRATE 0.5 MG PO TABS: ORAL_TABLET | ORAL | 0 refills | Status: AC

## 2023-03-19 NOTE — Patient Instructions (Signed)
Medication Instructions:   START Chantix Take 1 pill daily for 3 days then start the 1 mg tablets as directed.  Take 1 tablet twice daily for 11 weeks   *If you need a refill on your cardiac medications before your next appointment, please call your pharmacy*   Lab Work:  Your physician recommends that you return for a FASTING lipid profile in 2 months. Paperwork given to pt today.    If you have labs (blood work) drawn today and your tests are completely normal, you will receive your results only by: MyChart Message (if you have MyChart) OR A paper copy in the mail If you have any lab test that is abnormal or we need to change your treatment, we will call you to review the results.   Testing/Procedures:  None ordered.   Follow-Up: At [facility], you and your health needs are our priority.  As part of our continuing mission to provide you with exceptional heart care, we have created designated Provider Care Teams.  These Care Teams include your primary Cardiologist (physician) and Advanced Practice Providers (APPs -  Physician Assistants and Nurse Practitioners) who all work together to provide you with the care you need, when you need it.  We recommend signing up for the patient portal called "MyChart".  Sign up information is provided on this After Visit Summary.  MyChart is used to connect with patients for Virtual Visits (Telemedicine).  Patients are able to view lab/test results, encounter notes, upcoming appointments, etc.  Non-urgent messages can be sent to your provider as well.   To learn more about what you can do with MyChart, go to [email or web address].    Your next appointment:   3 month(s)  Provider:   Elder Negus, MD     Other Instructions  Pt was given a work note today.     1st Floor: - Lobby - Registration  - Pharmacy  - Lab - Cafe  2nd Floor: - PV Lab - Diagnostic Testing (echo, CT, nuclear med)  3rd Floor: - Vacant  4th  Floor: - TCTS (cardiothoracic surgery) - AFib Clinic - Structural Heart Clinic - Vascular Surgery  - Vascular Ultrasound  5th Floor: - [facility] Cardiology (general and EP) - Clinical Pharmacy for coumadin, hypertension, lipid, weight-loss medications, and med management appointments    Valet parking services will be available as well.        Mediterranean Diet  Why follow it? Research shows. Those who follow the Mediterranean diet have a reduced risk of heart disease  The diet is associated with a reduced incidence of Parkinson's and Alzheimer's diseases People following the diet may have longer life expectancies and lower rates of chronic diseases  The Dietary Guidelines for Americans recommends the Mediterranean diet as an eating plan to promote health and prevent disease  What Is the Mediterranean Diet?  Healthy eating plan based on typical foods and recipes of Mediterranean-style cooking The diet is primarily a plant based diet; these foods should make up a majority of meals   Starches - Plant based foods should make up a majority of meals - They are an important sources of vitamins, minerals, energy, antioxidants, and fiber - Choose whole grains, foods high in fiber and minimally processed items  - Typical grain sources include wheat, oats, barley, corn, brown rice, bulgar, farro, millet, polenta, couscous  - Various types of beans include chickpeas, lentils, fava beans, black beans, white beans   Fruits  Veggies - Large  quantities of antioxidant rich fruits & veggies; 6 or more servings  - Vegetables can be eaten raw or lightly drizzled with oil and cooked  - Vegetables common to the traditional Mediterranean Diet include: artichokes, arugula, beets, broccoli, brussel sprouts, cabbage, carrots, celery, collard greens, cucumbers, eggplant, kale, leeks, lemons, lettuce, mushrooms, okra, onions, peas, peppers, potatoes, pumpkin, radishes, rutabaga, shallots, spinach, sweet  potatoes, turnips, zucchini - Fruits common to the Mediterranean Diet include: apples, apricots, avocados, cherries, clementines, dates, figs, grapefruits, grapes, melons, nectarines, oranges, peaches, pears, pomegranates, strawberries, tangerines  Fats - Replace butter and margarine with healthy oils, such as olive oil, canola oil, and tahini  - Limit nuts to no more than a handful a day  - Nuts include walnuts, almonds, pecans, pistachios, pine nuts  - Limit or avoid candied, honey roasted or heavily salted nuts - Olives are central to the [employer] - can be eaten whole or used in a variety of dishes   Meats Protein - Limiting red meat: no more than a few times a month - When eating red meat: choose lean cuts and keep the portion to the size of deck of cards - Eggs: approx. 0 to 4 times a week  - Fish and lean poultry: at least 2 a week  - Healthy protein sources include, chicken, [place], lean beef, lamb - Increase intake of seafood such as tuna, salmon, trout, mackerel, shrimp, scallops - Avoid or limit high fat processed meats such as sausage and bacon  Dairy - Include moderate amounts of low fat dairy products  - Focus on healthy dairy such as fat free yogurt, skim milk, low or reduced fat cheese - Limit dairy products higher in fat such as whole or 2% milk, cheese, ice cream  Alcohol - Moderate amounts of red wine is ok  - No more than 5 oz daily for women (all ages) and men older than age 65  - No more than 10 oz of wine daily for men younger than 32  Other - Limit sweets and other desserts  - Use herbs and spices instead of salt to flavor foods  - Herbs and spices common to the traditional Mediterranean Diet include: basil, bay leaves, chives, cloves, cumin, fennel, garlic, lavender, marjoram, mint, oregano, parsley, pepper, rosemary, sage, savory, sumac, tarragon, thyme   It's not just a diet, it's a lifestyle:  The Mediterranean diet includes lifestyle factors typical of  those in the region  Foods, drinks and meals are best eaten with others and savored Daily physical activity is important for overall good health This could be strenuous exercise like running and aerobics This could also be more leisurely activities such as walking, housework, yard-work, or taking the stairs Moderation is the key; a balanced and healthy diet accommodates most foods and drinks Consider portion sizes and frequency of consumption of certain foods   Meal Ideas & Options:  Breakfast:  Whole wheat toast or whole wheat English muffins with peanut butter & hard boiled egg Steel cut oats topped with apples & cinnamon and skim milk  Fresh fruit: banana, strawberries, melon, berries, peaches  Smoothies: strawberries, bananas, greek yogurt, peanut butter Low fat greek yogurt with blueberries and granola  Egg white omelet with spinach and mushrooms Breakfast couscous: whole wheat couscous, apricots, skim milk, cranberries  Sandwiches:  Hummus and grilled vegetables (peppers, zucchini, squash) on whole wheat bread   Grilled chicken on whole wheat pita with lettuce, tomatoes, cucumbers or tzatziki  [place] salad on whole  wheat bread: tuna salad made with greek yogurt, olives, red peppers, capers, green onions Garlic rosemary lamb pita: lamb sauted with garlic, rosemary, salt & pepper; add lettuce, cucumber, greek yogurt to pita - flavor with lemon juice and black pepper  Seafood:  Mediterranean grilled salmon, seasoned with garlic, basil, parsley, lemon juice and black pepper Shrimp, lemon, and spinach whole-grain pasta salad made with low fat greek yogurt  Seared scallops with lemon orzo  Seared tuna steaks seasoned salt, pepper, coriander topped with tomato mixture of olives, tomatoes, olive oil, minced garlic, parsley, green onions and cappers  Meats:  Herbed greek chicken salad with kalamata olives, cucumber, feta  Red bell peppers stuffed with spinach, bulgur, lean ground beef (or  lentils) & topped with feta   Kebabs: skewers of chicken, tomatoes, onions, zucchini, squash  [place] burgers: made with red onions, mint, dill, lemon juice, feta cheese topped with roasted red peppers Vegetarian Cucumber salad: cucumbers, artichoke hearts, celery, red onion, feta cheese, tossed in olive oil & lemon juice  Hummus and whole grain pita points with a greek salad (lettuce, tomato, feta, olives, cucumbers, red onion) Lentil soup with celery, carrots made with vegetable broth, garlic, salt and pepper  Tabouli salad: parsley, bulgur, mint, scallions, cucumbers, tomato, radishes, lemon juice, olive oil, salt and pepper.  Tackling Obesity with Lifestyle Changes  Obesity- What is it? And What can we do about it?  Obesity is a chronic complex disease defined as excessive fat deposits that can have a negative effect on our health. It can lead to many other diseases including type 2 diabetes.  Weight gain occurs when the amount of energy (calories) we consume is greater than the amount we use.  When our energy output is greater than our energy input we lose weight. The basic concept is simple, but in reality, it's much more complicated.  Unfortunately, in some people, our bodies have many ways it can compensate when we try to eat less and move more which can prevent [place] from changing our weight. This can lead to some people having a much more difficult time losing weight even when they put healthy habits into practice. This can be frustrating. We want to focus on healthy habits, physical activity and how we feel, and less the number on the scale.  Food As Energy  Calories  Calories is just a unit of measurement for energy.  Counting calories is not required to lose weight but counting for a short period of time can:   help you learn good portion sizes   Learn what your true energy needs are.   Help you be more aware of your snacking or grazing habits  To help calculate how many  calories you should be eating, the NIH has a great body weight planner calculator at BeverageBuggy.si  Types of Energy Expenditure  Basal Metabolic Rate (BMR) Energy that our bodies use to preform everyday tasks. More muscle mass through resistance training can increase this a small amount  Thermic Effect of Food The amount of energy that it takes to breakdown the food we eat. This will be highest when we eat protein and fiber rich foods  Exercise Energy Expenditure The amount of energy used during formal exercise (walking, biking, weightlifting)  Non-exercise activity thermogenesis (NEAT) The amount of energy spent on activities that are not formal exercise (standing, fidgeting). Therefore, it is not only important to do formal exercise but also move around throughout the day.  Managing The Meal  Macro  nutrients (carbohydrates, fats and protein, fiber, water)  Micronutrients (vitamins, minerals)  Dietary Fiber  Benefits Examples Cautions  Soluble fiber  Decreases cholesterol  improve blood sugar control,  Feeds our gut bacteria  Allows [place] to feel fuller for longer so we eat less  fruits  oats  barley  legumes  peas  Beans  vegetables (broccoli) and root vegetables (carrots) Add fiber into your diet slowly and be sure to drink at least 8 cups of water a day. This will help limit gas, bloating, diarrhea, or constipation.  Insoluble Fiber  Improves digestive health by making stool easier to pass  Allows [place] to feel fuller for longer so we eat less  whole grains  nuts  seeds  skin of fruit  vegetables (green beans, zucchini, cauliflower)  Tricks to add more fiber to your diet   Add beans (pinto, kidney, lima, navy and garbanzo) to salads, ground meat or brown rice   Add nuts or seeds and or fresh/frozen fruit to yogurt, cottage cheese, salads or steel cut oats   Cut up vegetables and eat with hummus   Look for unsweetened whole grain cereals with at least 5g  of fiber per serving   Switch to whole grain bread. Look for bread that has whole grain flour as the first ingredient and has more fiber than carbs if you were to multiple the fiber x 10.   Try bulgar, barely, quinoa, buckwheat, brown rice wild rice instead of white rice   Keep frozen vegetables on hand to add to dishes or soups  Meal Planning:  Meal planning is the key to setting you up for success. Here are some examples of healthy meal options.  Breakfast  Option 1: Omelette with vegetables (1 egg, spinach, mushrooms, or other vegetable of your choice), 2 slices whole-grain toast, tip of thumb size butter or soft margarine,  cup low-fat milk or yogurt  Option 2: steel-cut rolled oats (? cup dry), 1 tbsp peanut butter added to cooked oats,  cup low-fat milk.  Option 3: 2 slices whole-grain or rye toast with avocado spread ( small avocado mased with herbs and pepper to taste), 1 poached egg or [place] up (cooked to your liking)  Option 4:  cup plain 0% [place] yogurt topped with  cup berries and  cup walnuts or almonds, 2 slices whole-grain or rye toast, tip of thumb size soft margarine/butter  Lunch:  Option 1: 2 cups red lentil soup, green salad with 1 tbsp homemade vinaigrette (extra virgin olive oil and vinegar of choice plus spices)  Option 2: 3 oz. roasted chicken, 2 slices whole-grain bread, 2 tsp mayonnaise, mustard, lettuce, tomato if desired, 1 fruit (example: medium-sized apple or small pear)  Option 3: 3 oz. tuna packed in water, 1 whole-wheat pita (6 inch), 2 tsp mayonnaise, lettuce, tomato, or other non-starchy vegetable of your choice, 1 fruit (example: medium-sized apple or small pear)  Option 4: 1 serving of garden veggie buddha bowl with lentils and tahini sauce and 1 cup berries topped with  cup plain 0% Greek yogurt  Dinner:  Option 1: 1 serving roasted cauliflower salad, 3-4 oz. grilled or baked pork loin chop, 1/2 cup mashed potato, or brown rice or  quinoa  Option 2: 1 serving fish (baked, grilled or air fried), green salad, 1 tbsp homemade vinaigrette,  cup cooked couscous  Option 3: 1 cup cooked whole grained pasta (example: spaghetti, spirals, macaroni),  cup favorite pasta sauce (preferably homemade), 3-4 oz. grilled or  baked chicken, green salad, 1 tbsp homemade vinaigrette  Option 4: 1 serving oven roasted salmon,  cup mashed sweet potato or couscous or brown rice or quinoa, broccoli (steamed or roasted)  Healthy snacks:   Carrots or celery with 1 tbsp of hummus   1 medium-sized fruit (apple or orange)   1 cup plain 0% [place] yogurt with  cup berries   Half apple, sliced, with 1 tbsp (15 mL) peanut or almond butter  Dining out:  Eating away from home has become a part of many people's lifestyle. Making healthy choices when you are eating out is important too. Portion size is an important part of healthy choices. Most branded fast-food places provide calories, sodium, and fat content for their menu items. www.calorieking.com would be great resource to find nutrition facts for your favorite brands and fast-food restaurants. Company specific website can be Chief Technology Officer for nutrition information for their items. (e.g. www.mcdonalds.com or www.nutritionix.com/[place]/menu/premium)  Here are some tips to help you make wise food choices when you are dining out.  Chose more often Avoid  Beverages   Choose more often: Water, low fat milk  Sugar-free/diet drinks  Unsweet tea or coffee    Avoid: Milkshakes, fruit drinks, regular pop  Alcohol, specialty drinks (e.g. iced cappuccino)  Fast food  Choose more often:  Garden salad  Mini subs, pita sandwiches ect with extra vegetables  plain burgers, grilled chicken  Vegetarian or cheese pizza with whole-grain crust    Avoid: Burgers/sandwiches with bacon, cheese, and high-fat sauces  [place] fries, fried chicken, fried fish, poutine, hash browns  Pizza with  processed meats  Starters   Choose more often: Raw vegetables, salads (garden, spinach, fruit)  clear or vegetable soups  Seafood cocktail  Whole-grain breads and rolls    Avoid: Salads with high-fat dressings or toppings  Creamy soups  Wings, egg rolls  onion rings, nachos  White or garlic bread  Main courses Grains & Starches (amount equal to  of your plate)  Choose more often:  Oatmeal, high-fiber/lower-sugar cereals  Whole-grain breads, rice, pasta, barley, couscous  Sweet potatoes    Avoid: Sugary, low-fiber cereals  Large bagels, muffins, croissants, white bread  [place] fries, hash browns, fried rice   Meat and alternative (amount equal to  of your plate)  Choose more often:  Lean meats, poultry, fish, eggs, low-fat cheese  Tofu, vegetable protein Legumes (e.g. lentils, chickpeas, beans)    Avoid: High-salt and/or high-fat meats (e.g. ribs, wings, sausages, wieners, processed lunch meats, imposter meats)   Vegetables (amount equal to  of your plate)  Choose more often:  Salads ([place], garden, spinach), plain vegetables   Avoid:  Salads with creamy, high-fat dressings and   Vegetables on sandwiches ect toppings like bacon bits, croutons, cheese  Desserts  Choose more often:  Fresh fruit, frozen yogourt, skim milk latte    Avoid: Cakes, pies, pastries, ice cream, cheesecake   Adopting a Healthy Lifestyle.   Weight: Know what a healthy weight is for you (roughly BMI <25) and aim to maintain this. You can calculate your body mass index on your smart phone. Unfortunately, this is not the most accurate measure of healthy weight, but it is the simplest measurement to use. A more accurate measurement involves body scanning which measures lean muscle, fat tissue and bony density. We do not have this equipment at [facility].    Diet: Aim for 7+ servings of fruits and vegetables daily Limit animal fats in diet for cholesterol and  heart health - choose grass fed  whenever available Avoid highly processed foods (fast food burgers, tacos, fried chicken, pizza, hot dogs, french fries)  Saturated fat comes in the form of butter, lard, coconut oil, margarine, partially hydrogenated oils, and fat in meat. These increase your risk of cardiovascular disease.  Use healthy plant oils, such as olive, canola, soy, corn, sunflower and peanut.  Whole foods such as fruits, vegetables and whole grains have fiber  Men need > 38 grams of fiber per day Women need > 25 grams of fiber per day  Load up on vegetables and fruits - one-half of your plate: Aim for color and variety, and remember that potatoes dont count. Go for whole grains - one-quarter of your plate: Whole wheat, barley, wheat berries, quinoa, oats, brown rice, and foods made with them. If you want pasta, go with whole wheat pasta. Protein power - one-quarter of your plate: Fish, chicken, beans, and nuts are all healthy, versatile protein sources. Limit red meat. You need carbohydrates for energy! The type of carbohydrate is more important than the amount. Choose carbohydrates such as vegetables, fruits, whole grains, beans, and nuts in the place of white rice, white pasta, potatoes (baked or fried), macaroni and cheese, cakes, cookies, and donuts.  If youre thirsty, drink water. Coffee and tea are good in moderation, but skip sugary drinks and limit milk and dairy products to one or two daily servings. Keep sugar intake at 6 teaspoons or 24 grams or LESS       Exercise: Aim for 150 min of moderate intensity exercise weekly for heart health, and weights twice weekly for bone health Stay active - any steps are better than no steps! Aim for 7-9 hours of sleep daily

## 2023-03-20 ENCOUNTER — Encounter: Payer: Self-pay | Admitting: Family Medicine

## 2023-03-20 ENCOUNTER — Telehealth: Payer: Self-pay | Admitting: Nurse Practitioner

## 2023-03-20 ENCOUNTER — Ambulatory Visit (INDEPENDENT_AMBULATORY_CARE_PROVIDER_SITE_OTHER): Payer: BC Managed Care – PPO | Admitting: Family Medicine

## 2023-03-20 VITALS — BP 132/65 | HR 90 | Ht 70.0 in | Wt 334.3 lb

## 2023-03-20 DIAGNOSIS — I10 Essential (primary) hypertension: Secondary | ICD-10-CM | POA: Insufficient documentation

## 2023-03-20 DIAGNOSIS — E785 Hyperlipidemia, unspecified: Secondary | ICD-10-CM

## 2023-03-20 DIAGNOSIS — I2102 ST elevation (STEMI) myocardial infarction involving left anterior descending coronary artery: Secondary | ICD-10-CM | POA: Diagnosis not present

## 2023-03-20 DIAGNOSIS — E1169 Type 2 diabetes mellitus with other specified complication: Secondary | ICD-10-CM

## 2023-03-20 DIAGNOSIS — I152 Hypertension secondary to endocrine disorders: Secondary | ICD-10-CM

## 2023-03-20 DIAGNOSIS — E1159 Type 2 diabetes mellitus with other circulatory complications: Secondary | ICD-10-CM

## 2023-03-20 DIAGNOSIS — I2510 Atherosclerotic heart disease of native coronary artery without angina pectoris: Secondary | ICD-10-CM

## 2023-03-20 DIAGNOSIS — Z720 Tobacco use: Secondary | ICD-10-CM

## 2023-03-20 DIAGNOSIS — Z7984 Long term (current) use of oral hypoglycemic drugs: Secondary | ICD-10-CM

## 2023-03-20 NOTE — Progress Notes (Signed)
New patient visit   Patient: Bradley Scott   DOB: Aug 25, 1975   48 y.o. Male  MRN: [id number] Visit Date: 03/20/2023  Today's healthcare provider: Shirlee Latch, MD   Chief Complaint  Patient presents with   New Patient (Initial Visit)   Hospitalization Follow-up    Patient reports feeling good since discharge and no issues at all   Immunizations    Declined all vaccines   Subjective    Bradley Scott is a 48 y.o. male who presents today as a new patient to establish care.   Discussed the use of AI scribe software for clinical note transcription with the patient, who gave verbal consent to proceed.  History of Present Illness   The patient, a Human resources officer, presents for a follow-up visit after a recent hospitalization for a heart attack and a new diagnosis of diabetes. The patient reports that he has been trying to quit smoking and has reduced his intake from two packs a day to half a pack a day over the past two weeks. He has also been prescribed Chantix to aid in smoking cessation.  The patient acknowledges the need for weight loss and expresses a desire to make lifestyle changes to manage his diabetes. He reports a family history of obesity and expresses concern about his current weight, which he describes as the heaviest he has ever been. The patient is interested in understanding more about managing diabetes through diet and exercise and is open to the possibility of medication to aid in weight loss in the future.  The patient is currently on several medications including metformin for diabetes, losartan for heart protection, metoprolol XL for heart rate control, rosuvastatin for cholesterol management, and Brilinta as a blood thinner. He reports no issues with these medications.         Past Medical History:  Diagnosis Date   ADHD    Past Surgical History:  Procedure Laterality Date   CORONARY/GRAFT ACUTE MI REVASCULARIZATION N/A 03/04/2023   Procedure:  Coronary/Graft Acute MI Revascularization;  Surgeon: Alwyn Pea, MD;  Location: [facility] INVASIVE CV LAB;  Service: Cardiovascular;  Laterality: N/A;   ENDOVASCULAR STENT INSERTION Right 03/05/2023   Procedure: ENDOVASCULAR STENT GRAFT INSERTION INTO SUPERFICIAL FEMORAL ARTERY USING VIABAHN STENT X 5CM;  Surgeon: Daria Pastures, MD;  Location: [facility] OR;  Service: Vascular;  Laterality: Right;   LEFT HEART CATH AND CORONARY ANGIOGRAPHY N/A 03/04/2023   Procedure: LEFT HEART CATH AND CORONARY ANGIOGRAPHY;  Surgeon: Alwyn Pea, MD;  Location: [facility] INVASIVE CV LAB;  Service: Cardiovascular;  Laterality: N/A;   LOWER EXTREMITY ANGIOGRAM Right 03/05/2023   Procedure: Ronny Flurry;  Surgeon: Daria Pastures, MD;  Location: [facility];  Service: Vascular;  Laterality: Right;   RIGHT HEART CATH N/A 03/04/2023   Procedure: RIGHT HEART CATH;  Surgeon: Alwyn Pea, MD;  Location: [facility] INVASIVE CV LAB;  Service: Cardiovascular;  Laterality: N/A;   WISDOM TOOTH EXTRACTION     Family Status  Relation Name Status   Mother  Alive   Father  Alive   Son  (Not Specified)   Neg Hx  (Not Specified)  No partnership data on file   Family History  Problem Relation Age of Onset   Breast cancer Mother 30   Obesity Father    ADD / ADHD Son    Colon cancer Neg Hx    Prostate cancer Neg Hx    Social History   Socioeconomic History  Marital status: Married    Spouse name: Katie   Number of children: 2   Years of education: 12   Highest education level: High school graduate  Occupational History   Occupation: Nature conservation officer    Employer: [employer] secretary  Tobacco Use   Smoking status: Every Day    Current packs/day: 1.00    Average packs/day: 1 pack/day for 10.0 years (10.0 ttl pk-yrs)    Types: Cigarettes   Smokeless tobacco: Never   Tobacco comments:    started smoking in 2009.  Vaping Use   Vaping status: Never Used  Substance and Sexual Activity   Alcohol use: Not Currently     Comment: very occasional, socially   Drug use: Never   Sexual activity: Yes    Partners: Female  Other Topics Concern   Not on file  Social History Narrative   Not on file   Social Drivers of Health   Financial Resource Strain: Not on file  Food Insecurity: No Food Insecurity (03/05/2023)   Hunger Vital Sign    Worried About Running Out of Food in the Last Year: Never true    Ran Out of Food in the Last Year: Never true  Transportation Needs: No Transportation Needs (03/05/2023)   PRAPARE - Administrator, Civil Service (Medical): No    Lack of Transportation (Non-Medical): No  Physical Activity: Not on file  Stress: Not on file  Social Connections: Not on file   Outpatient Medications Prior to Visit  Medication Sig   aspirin 81 MG chewable tablet Chew 1 tablet (81 mg total) by mouth daily.   losartan (COZAAR) 25 MG tablet Take 0.5 tablets (12.5 mg total) by mouth daily.   metFORMIN (GLUCOPHAGE) 500 MG tablet Take 1 tablet (500 mg total) by mouth 2 (two) times daily with a meal.   metoprolol succinate (TOPROL-XL) 25 MG 24 hr tablet Take 0.5 tablets (12.5 mg total) by mouth daily.   nitroGLYCERIN (NITROSTAT) 0.4 MG SL tablet Place 1 tablet (0.4 mg total) under the tongue every 5 (five) minutes as needed.   rosuvastatin (CRESTOR) 20 MG tablet Take 1 tablet (20 mg total) by mouth daily.   ticagrelor (BRILINTA) 90 MG TABS tablet Take 1 tablet (90 mg total) by mouth 2 (two) times daily.   varenicline (CHANTIX CONTINUING MONTH PAK) 1 MG tablet Take 1 tablet twice daily for 11 weeks (Patient not taking: Reported on 03/20/2023)   varenicline (CHANTIX) 0.5 MG tablet Take 1 pill daily for 3 days then start the 1 mg tablets as directed (Patient not taking: Reported on 03/20/2023)   No facility-administered medications prior to visit.   No Known Allergies  Review of Systems      Objective    BP 132/65 (BP Location: Left Arm, Patient Position: Sitting, Cuff Size: Large)    Pulse 90   Ht 5\' 10"  (1.778 m)   Wt (!) 334 lb 4.8 oz (151.6 kg)   SpO2 96%   BMI 47.97 kg/m     Physical Exam Vitals reviewed.  Constitutional:      General: He is not in acute distress.    Appearance: Normal appearance. He is not diaphoretic.  HENT:     Head: Normocephalic and atraumatic.  Eyes:     General: No scleral icterus.    Conjunctiva/sclera: Conjunctivae normal.  Cardiovascular:     Rate and Rhythm: Normal rate and regular rhythm.     Heart sounds: Normal heart sounds. No murmur heard.  Pulmonary:     Effort: Pulmonary effort is normal. No respiratory distress.     Breath sounds: Normal breath sounds. No wheezing or rhonchi.  Musculoskeletal:     Cervical back: Neck supple.     Right lower leg: No edema.     Left lower leg: No edema.  Lymphadenopathy:     Cervical: No cervical adenopathy.  Skin:    General: Skin is warm and dry.     Findings: No rash.  Neurological:     Mental Status: He is alert and oriented to person, place, and time. Mental status is at baseline.  Psychiatric:        Mood and Affect: Mood normal.        Behavior: Behavior normal.     Depression Screen    03/20/2023   11:00 AM 10/02/2017   10:28 AM  PHQ 2/9 Scores  PHQ - 2 Score 0 0   No results found for any visits on 03/20/23.  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 Currently on metformin. Discussed chronic nature, potential for achieving normal blood sugar levels with weight loss, and importance of diet and exercise. Potential future use of GLP-1 agonists like Ozempic for better glycemic control and weight loss. Regular screenings for complications, including annual eye exams and urine tests for microalbuminuria, are essential. - Continue metformin - Implement dietary  changes focusing on limiting carbohydrate intake to two servings per meal - Increase physical activity - Recheck HbA1c in three months - Consider GLP-1 agonist (Ozempic) at next follow-up if needed - Perform urine test for microalbuminuria - Refer to eye doctor for annual diabetic eye exam      Relevant Orders   Urine Microalbumin w/creat. ratio   CAD (coronary artery disease)   Hypertension associated with diabetes (HCC)     Endocrine   Hyperlipidemia associated with type 2 diabetes mellitus (HCC)     Other   Morbid obesity (HCC)   Tobacco abuse   Current smoker, recently reduced from two packs per day to half a pack per day. Started Chantix today. Discussed importance of quitting smoking to reduce cardiovascular risk. Complete cessation significantly reduces future cardiac events risk. - Continue Chantix - Encourage complete smoking cessation           Myocardial Infarction (Post-Heart Attack) Recent myocardial infarction requiring hospitalization. Currently on baby aspirin, Brilinta, losartan, metoprolol XL, and rosuvastatin. Blood pressure is well-controlled. Emphasis on managing risk factors to prevent future cardiac events. Discussed medication adherence and lifestyle changes. Rosuvastatin aims to reduce LDL cholesterol by approximately 50%, targeting levels below 70 mg/dL. - Continue baby aspirin - Continue Brilinta - Continue losartan 12.5 mg daily - Continue metoprolol XL 12.5 mg daily - Continue rosuvastatin 20 mg daily - Recheck lipid panel in three months  General Health Maintenance Discussed importance of regular screenings and vaccinations. Recommended delaying colonoscopy until further from recent heart attack. Discussed benefits of pneumonia vaccine for patients with diabetes and heart disease. - Delay colonoscopy for a few months - Consider pneumonia vaccine at next visit  Follow-up - Schedule follow-up appointment in three months - Perform foot exam.         Return in about 3 months (around 06/18/2023) for chronic disease f/u.  Shirlee Latch, MD  [facility] [phone] (phone) [phone] (fax)  [facility]

## 2023-03-20 NOTE — Telephone Encounter (Signed)
Patient reports that his work will not let him return if he has any restrictions. The letter that was given to him yesterday states that he may return "with limited physical activity." He states that the letter just has to state that he is able to return to work. 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 Will send to [facility] for advisement.

## 2023-03-20 NOTE — Telephone Encounter (Signed)
New Message:    Patient says he needs to talked to [facility] nurse., concerning his letter to return to work. He says he will them them what heis job need stated in the letter.

## 2023-03-20 NOTE — Assessment & Plan Note (Signed)
New diagnosis. Currently on metformin. Discussed chronic nature, potential for achieving normal blood sugar levels with weight loss, and importance of diet and exercise. Potential future use of GLP-1 agonists like Ozempic for better glycemic control and weight loss. Regular screenings for complications, including annual eye exams and urine tests for microalbuminuria, are essential. - Continue metformin - Implement dietary changes focusing on limiting carbohydrate intake to two servings per meal - Increase physical activity - Recheck HbA1c in three months - Consider GLP-1 agonist (Ozempic) at next follow-up if needed - Perform urine test for microalbuminuria - Refer to eye doctor for annual diabetic eye exam

## 2023-03-20 NOTE — Patient Instructions (Signed)
  Diet Recommendations for Diabetes   Starchy (carb) foods include: Bread, rice, pasta, potatoes, corn, crackers, bagels, muffins, all baked goods.  (Fruits, milk, and yogurt also have carbohydrate, but most of these foods will not spike your blood sugar as the starchy foods will.)  A few fruits do cause high blood sugars; use small portions of bananas (limit to 1/2 at a time), grapes, watermelon, and most tropical fruits.    Protein foods include: Meat, fish, poultry, eggs, dairy foods, and beans such as pinto and kidney beans (beans also provide carbohydrate).   1. Limit starchy foods to TWO per meal and ONE per snack. ONE portion of a starchy  food is equal to the following:   - ONE slice of bread (or its equivalent, such as half of a hamburger bun).   - 1/2 cup of a "scoopable" starchy food such as potatoes or rice.   - 15 grams of carbohydrate as shown on food label.  2. Include at every meal: a protein food, a carb food, and vegetables and/or fruit.   - Obtain twice as many veg's as protein or carbohydrate foods for both lunch and dinner.   - Fresh or frozen veg's are best.   - Try to keep frozen veg's on hand for a quick vegetable serving.        

## 2023-03-20 NOTE — Assessment & Plan Note (Signed)
Current smoker, recently reduced from two packs per day to half a pack per day. Started Chantix today. Discussed importance of quitting smoking to reduce cardiovascular risk. Complete cessation significantly reduces future cardiac events risk. - Continue Chantix - Encourage complete smoking cessation

## 2023-03-21 ENCOUNTER — Encounter: Payer: Self-pay | Admitting: Nurse Practitioner

## 2023-03-21 LAB — MICROALBUMIN / CREATININE URINE RATIO
Creatinine, Urine: 154 mg/dL
Microalb/Creat Ratio: 4 mg/g{creat} (ref 0–29)
Microalbumin, Urine: 6.5 ug/mL

## 2023-03-21 LAB — SPECIMEN STATUS REPORT

## 2023-03-21 NOTE — Telephone Encounter (Signed)
I have completed an updated letter.

## 2023-03-23 ENCOUNTER — Encounter: Payer: Self-pay | Admitting: Family Medicine

## 2023-03-27 ENCOUNTER — Other Ambulatory Visit: Payer: Self-pay | Admitting: *Deleted

## 2023-03-27 DIAGNOSIS — I739 Peripheral vascular disease, unspecified: Secondary | ICD-10-CM

## 2023-03-30 ENCOUNTER — Other Ambulatory Visit (HOSPITAL_COMMUNITY): Payer: Self-pay

## 2023-04-08 NOTE — Progress Notes (Deleted)
 Patient ID: Bradley Scott, male   DOB: 28-Feb-1976, 49 y.o.   MRN: [id number]  Reason for Consult: No chief complaint on file.   Referred by Myrla Jon HERO, MD  Subjective:     HPI  Bradley Scott is a 48 y.o. 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  He denies any issues in the left groin access site.  He denies any lower extremity pain with ambulation, rest pain or tissue loss.  He is a current smoker.  Past Medical History:  Diagnosis Date   ADHD    Family History  Problem Relation Age of Onset   Breast cancer Mother 32   Obesity Father    ADD / ADHD Son    Colon cancer Neg Hx    Prostate cancer Neg Hx    Past Surgical History:  Procedure Laterality Date   CORONARY/GRAFT ACUTE MI REVASCULARIZATION N/A 03/04/2023   Procedure: Coronary/Graft Acute MI Revascularization;  Surgeon: Florencio Cara BIRCH, MD;  Location: [facility] INVASIVE CV LAB;  Service: Cardiovascular;  Laterality: N/A;   ENDOVASCULAR STENT INSERTION Right 03/05/2023   Procedure: ENDOVASCULAR STENT GRAFT INSERTION INTO SUPERFICIAL FEMORAL ARTERY USING VIABAHN STENT X 5CM;  Surgeon: Pearline Norman RAMAN, MD;  Location: [facility] OR;  Service: Vascular;  Laterality: Right;   LEFT HEART CATH AND CORONARY ANGIOGRAPHY N/A 03/04/2023   Procedure: LEFT HEART CATH AND CORONARY ANGIOGRAPHY;  Surgeon: Florencio Cara BIRCH, MD;  Location: [facility] INVASIVE CV LAB;  Service: Cardiovascular;  Laterality: N/A;   LOWER EXTREMITY ANGIOGRAM Right 03/05/2023   Procedure: EZELLA;  Surgeon: Pearline Norman RAMAN, MD;  Location: [facility] OR;  Service: Vascular;  Laterality: Right;   RIGHT HEART CATH N/A 03/04/2023   Procedure: RIGHT HEART CATH;  Surgeon: Florencio Cara BIRCH, MD;  Location: [facility] INVASIVE CV LAB;  Service: Cardiovascular;  Laterality: N/A;   WISDOM TOOTH EXTRACTION      Short Social History:  Social History   Tobacco Use   Smoking  status: Every Day    Current packs/day: 1.00    Average packs/day: 1 pack/day for 10.0 years (10.0 ttl pk-yrs)    Types: Cigarettes   Smokeless tobacco: Never   Tobacco comments:    started smoking in 2009.  Substance Use Topics   Alcohol use: Not Currently    Comment: very occasional, socially    No Known Allergies  Current Outpatient Medications  Medication Sig Dispense Refill   aspirin  81 MG chewable tablet Chew 1 tablet (81 mg total) by mouth daily. 90 tablet 2   losartan  (COZAAR ) 25 MG tablet Take 0.5 tablets (12.5 mg total) by mouth daily. 45 tablet 3   metFORMIN  (GLUCOPHAGE ) 500 MG tablet Take 1 tablet (500 mg total) by mouth 2 (two) times daily with a meal. 60 tablet 1   metoprolol  succinate (TOPROL -XL) 25 MG 24 hr tablet Take 0.5 tablets (12.5 mg total) by mouth daily. 45 tablet 3   nitroGLYCERIN  (NITROSTAT ) 0.4 MG SL tablet Place 1 tablet (0.4 mg total) under the tongue every 5 (five) minutes as needed. 25 tablet 2   rosuvastatin  (CRESTOR ) 20 MG tablet Take 1 tablet (20 mg total) by mouth daily. 90 tablet 1   ticagrelor  (BRILINTA ) 90 MG TABS tablet Take 1 tablet (90 mg total) by mouth 2 (two) times daily. 180 tablet 2   varenicline  (CHANTIX  CONTINUING MONTH PAK) 1 MG tablet Take 1 tablet twice  daily for 11 weeks (Patient not taking: Reported on 03/20/2023) 60 tablet 3   varenicline  (CHANTIX ) 0.5 MG tablet Take 1 pill daily for 3 days then start the 1 mg tablets as directed (Patient not taking: Reported on 03/20/2023) 3 tablet 0   No current facility-administered medications for this visit.    REVIEW OF SYSTEMS  Positive for ***  All other systems were reviewed and are negative     Objective:  Objective   There were no vitals filed for this visit. There is no height or weight on file to calculate BMI.  Physical Exam General: no acute distress Cardiac: hemodynamically stable Pulm: normal work of breathing Neuro: alert, no focal deficit Extremities: no edema,  cyanosis or wounds*** Vascular:   Right: ***  Left: ***   Data: ABI ***  RLE duplex ***  BMP reviewed, Cr 0.92     Assessment/Plan:     Bradley Scott is a 48 y.o. male presenting for follow-up after right SFA Viabahn stent placement for repair of pseudoaneurysm after cardiac cath and failed balloon pump placement.  He has recovered well without any left groin access site issues.  The right SFA stent is widely patent and he has a palpable pulse distally.  Plan follow-up in 1 year with repeat ABI and right lower extremity duplex    Recommendations to optimize cardiovascular risk: Abstinence from all tobacco products. Blood glucose control with goal A1c < 7%. Blood pressure control with goal blood pressure < 140/90 mmHg. Lipid reduction therapy with goal LDL-C <100 mg/dL  Aspirin  81mg  PO QD.  Atorvastatin 40-80mg  PO QD (or other high intensity statin therapy).     Norman GORMAN Serve MD [facility]

## 2023-04-10 ENCOUNTER — Ambulatory Visit: Payer: BC Managed Care – PPO | Admitting: Vascular Surgery

## 2023-04-10 ENCOUNTER — Encounter (HOSPITAL_COMMUNITY): Payer: BC Managed Care – PPO

## 2023-04-10 ENCOUNTER — Ambulatory Visit (HOSPITAL_COMMUNITY): Payer: BC Managed Care – PPO

## 2023-04-10 ENCOUNTER — Ambulatory Visit (HOSPITAL_COMMUNITY): Payer: BC Managed Care – PPO | Attending: Surgery

## 2023-04-10 ENCOUNTER — Encounter (HOSPITAL_COMMUNITY): Payer: Self-pay

## 2023-04-10 DIAGNOSIS — I729 Aneurysm of unspecified site: Secondary | ICD-10-CM

## 2023-05-01 ENCOUNTER — Encounter: Payer: Self-pay | Admitting: Family Medicine

## 2023-05-01 DIAGNOSIS — E1159 Type 2 diabetes mellitus with other circulatory complications: Secondary | ICD-10-CM

## 2023-05-06 MED ORDER — METFORMIN HCL 500 MG PO TABS: 500.0000 mg | ORAL_TABLET | Freq: Two times a day (BID) | ORAL | 1 refills | Status: DC

## 2023-06-01 ENCOUNTER — Other Ambulatory Visit (HOSPITAL_COMMUNITY): Payer: Self-pay

## 2023-06-02 ENCOUNTER — Telehealth: Payer: Self-pay | Admitting: Cardiology

## 2023-06-02 ENCOUNTER — Ambulatory Visit: Payer: BC Managed Care – PPO | Attending: Cardiology | Admitting: Cardiology

## 2023-06-02 ENCOUNTER — Encounter: Payer: Self-pay | Admitting: Cardiology

## 2023-06-02 VITALS — BP 148/100 | HR 97 | Resp 16 | Ht 70.0 in | Wt 344.0 lb

## 2023-06-02 DIAGNOSIS — I25118 Atherosclerotic heart disease of native coronary artery with other forms of angina pectoris: Secondary | ICD-10-CM

## 2023-06-02 DIAGNOSIS — I10 Essential (primary) hypertension: Secondary | ICD-10-CM | POA: Diagnosis not present

## 2023-06-02 DIAGNOSIS — E782 Mixed hyperlipidemia: Secondary | ICD-10-CM | POA: Diagnosis not present

## 2023-06-02 DIAGNOSIS — F17210 Nicotine dependence, cigarettes, uncomplicated: Secondary | ICD-10-CM

## 2023-06-02 DIAGNOSIS — I724 Aneurysm of artery of lower extremity: Secondary | ICD-10-CM | POA: Diagnosis not present

## 2023-06-02 MED ORDER — LOSARTAN POTASSIUM 25 MG PO TABS: 25.0000 mg | ORAL_TABLET | Freq: Every day | ORAL | 3 refills | Status: AC

## 2023-06-02 MED ORDER — METOPROLOL SUCCINATE ER 25 MG PO TB24: 25.0000 mg | ORAL_TABLET | Freq: Every day | ORAL | 3 refills | Status: AC

## 2023-06-02 MED ORDER — NICOTINE POLACRILEX 2 MG MT GUM: 2.0000 mg | CHEWING_GUM | OROMUCOSAL | 1 refills | Status: AC | PRN

## 2023-06-02 MED ORDER — ASPIRIN 81 MG PO CHEW: 81.0000 mg | CHEWABLE_TABLET | Freq: Every day | ORAL | 2 refills | Status: AC

## 2023-06-02 MED ORDER — TICAGRELOR 90 MG PO TABS: 90.0000 mg | ORAL_TABLET | Freq: Two times a day (BID) | ORAL | 2 refills | Status: AC

## 2023-06-02 NOTE — Telephone Encounter (Signed)
 Returned a call back to [place] and was connected with the [employer] instead.   Informed Darl Pikes Pharmacist that per Dr. Rosemary Holms, pt can take no more than 5 a day of the nicorette gum.  Darl Pikes verbalized understanding and agrees with this plan.  Darl Pikes was gracious for all the assistance provided.

## 2023-06-02 NOTE — Telephone Encounter (Signed)
 Pt c/o medication issue:  1. Name of Medication:   nicotine polacrilex (NICORETTE) 2 MG gum   2. How are you currently taking this medication (dosage and times per day)?   3. Are you having a reaction (difficulty breathing--STAT)?   4. What is your medication issue?   Caller Nedra Hai) stated they will need to clarify how much of this medication the patient can take per day.

## 2023-06-02 NOTE — Telephone Encounter (Signed)
 Pt saw Dr. Rosemary Holms in the clinic this morning.   Will route this request to Dr. Rosemary Holms and RN for further clarification and follow-up with pts pharmacy.

## 2023-06-02 NOTE — Progress Notes (Signed)
 Cardiology Office Note:  .   Date:  06/02/2023  ID:  Sharrie Rothman, DOB 10-05-1975, MRN [id number] PCP: Erasmo Downer, MD  [facility] Providers Cardiologist:  Truett Mainland, MD PCP: Erasmo Downer, MD  Chief Complaint  Patient presents with   ST elevation myocardial infarction   Follow-up   Coronary artery disease involving native coronary artery of     Bradley Scott is a 48 y.o. male with hypertension, mixed hyperlipidemia, type 2 DM, nicotine dependence, obesity, CAD (S/p PPCI to LAD 03/2023), Rt SFA Viabahn stenting for pseudoaneurysm (03/2023)  Patient had anterior STEMI, presented to [facility] in 03/2023 and underwent primary PCI to mid LAD with 2 overlapping stents (Dr. Juliann Pares at [facility]), right SFA pseudoaneurysm with attempted IABP placement at [facility], for which she underwent right SFA stenting with Viabahn stent at [facility].  Patient is back to work full-time, working at [employer].  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  He is "on call 24/7".  He expresses that he is unable to undergo cardiac rehab due to his work responsibilities.  Fortunately, he has not had any chest pain since his MI.  His breathing is "back to 90%".  Unfortunately, he still smoking.  However, he is cut back from 2+ packs per day down to <1 pack/day.  He is compliant with his medical therapy.  Blood pressure is elevated today.  He has follow-up with his PCP later this week.   Vitals:   06/02/23 0808  BP: (!) 148/100  Pulse: 97  Resp: 16  SpO2: 94%      Review of Systems  Cardiovascular:  Positive for dyspnea on exertion (Improving). Negative for chest pain, leg swelling, palpitations and syncope.        Studies Reviewed: Marland Kitchen        Independently interpreted 03/2023: Chol 198, TG 127, HDL 25, LDL 148 HbA1C 6.5% Hb 12.3 Cr 0.9    Physical Exam Vitals and nursing note reviewed.  Constitutional:       General: He is not in acute distress.    Appearance: He is obese.  Neck:     Vascular: No JVD.  Cardiovascular:     Rate and Rhythm: Normal rate and regular rhythm.     Heart sounds: Normal heart sounds. No murmur heard. Pulmonary:     Effort: Pulmonary effort is normal.     Breath sounds: Normal breath sounds. No wheezing or rales.  Musculoskeletal:     Right lower leg: No edema.     Left lower leg: No edema.      VISIT DIAGNOSES:   ICD-10-CM   1. Coronary artery disease of native artery of native heart with stable angina pectoris (HCC)  I25.118     2. Essential hypertension  I10     3. Mixed hyperlipidemia  E78.2     4. Pseudoaneurysm of right femoral artery (HCC)  I72.4     5. Cigarette nicotine dependence without complication  F17.210      Assessment & Plan:  Bradley Scott is a 48 y.o. male with hypertension, mixed hyperlipidemia, type 2 DM, nicotine dependence, obesity, CAD (S/p PPCI to LAD 03/2023), Rt SFA Viabahn stenting for pseudoaneurysm (03/2023)   CAD: Anterior STEMI (03/2023) Culprit mid LAD, treated with 2 overlapping stents. Moderate OM3-medical treatment for now. Continue DAPT with aspirin and Brilinta at least till 03/2024. Statin nave at the time  of presentation with LDL 148, lipoprotein a normal.. Currently on Crestor 20 mg daily.  Recommend lipid panel check during PCP visit later this week.  See below regarding hypertension management.  Hypertension: Blood pressure uncontrolled. Increase metoprolol to succinate from 12.5 mg daily to 25 mg daily. Increase losartan from 12.5 mg daily to 25 mg daily. He will likely need further up titration or addition of another antihypertensive agent if blood pressure remains uncontrolled. Options would be increasing losartan further, or adding another agent such as amlodipine.   Right SFA pseudoaneurysm: Complication from unsuccessful access attempt for IABP placement (03/2023),. S/p right SFA Viabahn  stenting by vascular surgery  Continue DAPT.   Type II DM: Hemoglobin A1c 6.5%, new diagnosis Continue metformin 500 mg bid. Could consider Ozempic outpatient given CAD, DM, and morbid obesity.  Referred PCP.   Nicotine dependence: Tobacco cessation counseling:  - Currently smoking <1 packs/day   - Patient was informed of the dangers of tobacco abuse including stroke, cancer, and MI, as well as benefits of tobacco cessation. - Patient is willing to quit at this time. - Approximately 5 mins were spent counseling patient cessation techniques. We discussed various methods to help quit smoking, including deciding on a date to quit, joining a support group, pharmacological agents. Patient would like to use nicotine gum. He did not tolerate Chantix or nicotine patch in the past. - I will reassess his progress at the next follow-up visit   Meds ordered this encounter  Medications   aspirin 81 MG chewable tablet    Sig: Chew 1 tablet (81 mg total) by mouth daily.    Dispense:  90 tablet    Refill:  2   ticagrelor (BRILINTA) 90 MG TABS tablet    Sig: Take 1 tablet (90 mg total) by mouth 2 (two) times daily.    Dispense:  180 tablet    Refill:  2   metoprolol succinate (TOPROL-XL) 25 MG 24 hr tablet    Sig: Take 1 tablet (25 mg total) by mouth daily.    Dispense:  90 tablet    Refill:  3   losartan (COZAAR) 25 MG tablet    Sig: Take 1 tablet (25 mg total) by mouth daily.    Dispense:  90 tablet    Refill:  3   nicotine polacrilex (NICORETTE) 2 MG gum    Sig: Take 1 each (2 mg total) by mouth as needed for smoking cessation.    Dispense:  60 tablet    Refill:  1     F/u in 3 months  Signed, Elder Negus, MD

## 2023-06-02 NOTE — Telephone Encounter (Signed)
 Gum should substitute some cigarettes, but dot take mor ethan 5 a day.   Thanks MJP

## 2023-06-02 NOTE — Patient Instructions (Signed)
 Medication Instructions:  START Nicotine gum as needed   INCREASED Metoprolol to 25 mg take one tablet by mouth daily  INCREASED Losartan to 25 mg take one tablet by mouth daily   Refilled sent in today  Follow-Up: At [facility], you and your health needs are our priority.  As part of our continuing mission to provide you with exceptional heart care, our providers are all part of one team.  This team includes your primary Cardiologist (physician) and Advanced Practice Providers or APPs (Physician Assistants and Nurse Practitioners) who all work together to provide you with the care you need, when you need it.  Your next appointment:   3 month(s)  Provider:   Elder Negus, MD       Other Instructions      1st Floor: - Lobby - Registration  - Pharmacy  - Lab - Cafe  2nd Floor: - PV Lab - Diagnostic Testing (echo, CT, nuclear med)  3rd Floor: - Vacant  4th Floor: - TCTS (cardiothoracic surgery) - AFib Clinic - Structural Heart Clinic - Vascular Surgery  - Vascular Ultrasound  5th Floor: - HeartCare Cardiology (general and EP) - Clinical Pharmacy for coumadin, hypertension, lipid, weight-loss medications, and med management appointments    Valet parking services will be available as well.

## 2023-06-25 ENCOUNTER — Ambulatory Visit: Payer: Self-pay | Admitting: Family Medicine

## 2023-09-02 NOTE — Progress Notes (Deleted)
 Cardiology Office Note:  .   Date:  09/02/2023  ID:  Dorise MARLA Breen, DOB 1975/10/03, MRN [id number] PCP: Myrla Jon HERO, MD  [facility] Providers Cardiologist:  Newman Lawrence, MD PCP: Myrla Jon HERO, MD  No chief complaint on file.    HAZIM TREADWAY is a 48 y.o. male with hypertension, mixed hyperlipidemia, type 2 DM, nicotine  dependence, obesity, CAD (S/p PPCI to LAD 03/2023), Rt SFA Viabahn stenting for pseudoaneurysm (03/2023)  Patient had anterior STEMI, presented to [facility] in 03/2023 and underwent primary PCI to mid LAD with 2 overlapping stents (Dr. Florencio at [facility]), right SFA pseudoaneurysm with attempted IABP placement at [facility], for which she underwent right SFA stenting with Viabahn stent at [facility].  ***   There were no vitals filed for this visit.     Review of Systems  Cardiovascular:  Positive for dyspnea on exertion (Improving). Negative for chest pain, leg swelling, palpitations and syncope.        Studies Reviewed: SABRA        Independently interpreted 03/2023: Chol 198, TG 127, HDL 25, LDL 148 HbA1C 6.5% Hb 12.3 Cr 0.9    Physical Exam Vitals and nursing note reviewed.  Constitutional:      General: He is not in acute distress.    Appearance: He is obese.  Neck:     Vascular: No JVD.  Cardiovascular:     Rate and Rhythm: Normal rate and regular rhythm.     Heart sounds: Normal heart sounds. No murmur heard. Pulmonary:     Effort: Pulmonary effort is normal.     Breath sounds: Normal breath sounds. No wheezing or rales.  Musculoskeletal:     Right lower leg: No edema.     Left lower leg: No edema.      VISIT DIAGNOSES: No diagnosis found.  Assessment & Plan:  SHANAN MCMILLER is a 48 y.o. male with hypertension, mixed hyperlipidemia, type 2 DM, nicotine  dependence, obesity, CAD (S/p PPCI to LAD 03/2023), Rt SFA Viabahn stenting for pseudoaneurysm (03/2023)   CAD: Anterior STEMI (03/2023) Culprit mid LAD, treated with  2 overlapping stents. Moderate OM3-medical treatment for now. Continue DAPT with aspirin  and Brilinta  at least till 03/2024. Statin nave at the time of presentation with LDL 148, lipoprotein a normal.. Currently on Crestor  20 mg daily.  Recommend lipid panel check during PCP visit later this week.  See below regarding hypertension management.  *** Hypertension: Blood pressure uncontrolled. Increase metoprolol  to succinate from 12.5 mg daily to 25 mg daily. Increase losartan  from 12.5 mg daily to 25 mg daily. He will likely need further up titration or addition of another antihypertensive agent if blood pressure remains uncontrolled. Options would be increasing losartan  further, or adding another agent such as amlodipine.   Right SFA pseudoaneurysm: Complication from unsuccessful access attempt for IABP placement (03/2023),. S/p right SFA Viabahn stenting by vascular surgery  Continue DAPT.   Type II DM: Hemoglobin A1c 6.5%, new diagnosis Continue metformin  500 mg bid. Could consider Ozempic outpatient given CAD, DM, and morbid obesity.  Referred PCP.   Nicotine  dependence: Tobacco cessation counseling:  - Currently smoking <1 packs/day   - Patient was informed of the dangers of tobacco abuse including stroke, cancer, and MI, as well as benefits of tobacco cessation. - Patient is willing to quit at this time. - Approximately 5 mins were spent counseling patient cessation techniques. We discussed various methods to help quit smoking, including deciding on a date  to quit, joining a support group, pharmacological agents. Patient would like to use nicotine  gum. He did not tolerate Chantix  or nicotine  patch in the past. - I will reassess his progress at the next follow-up visit   No orders of the defined types were placed in this encounter.    F/u in 3 months  Signed, Newman JINNY Lawrence, MD

## 2023-09-03 ENCOUNTER — Ambulatory Visit: Attending: Cardiology | Admitting: Cardiology

## 2023-10-22 ENCOUNTER — Encounter: Payer: Self-pay | Admitting: Cardiology

## 2023-10-22 DIAGNOSIS — I2510 Atherosclerotic heart disease of native coronary artery without angina pectoris: Secondary | ICD-10-CM

## 2023-10-23 MED ORDER — ROSUVASTATIN CALCIUM 20 MG PO TABS: 20.0000 mg | ORAL_TABLET | Freq: Every day | ORAL | 3 refills | Status: AC

## 2023-10-23 NOTE — Telephone Encounter (Signed)
 Do not see that the patient did a updated lipid panel with PCP. Please advise refill?

## 2023-10-23 NOTE — Telephone Encounter (Signed)
 Okay to send prescription to our [facility].  (Check lipid panel as long as PCP has not checked it.  Thanks MJP

## 2023-11-19 ENCOUNTER — Other Ambulatory Visit: Payer: Self-pay | Admitting: Family Medicine

## 2023-11-19 DIAGNOSIS — E1159 Type 2 diabetes mellitus with other circulatory complications: Secondary | ICD-10-CM

## 2023-12-21 ENCOUNTER — Other Ambulatory Visit: Payer: Self-pay | Admitting: Family Medicine

## 2023-12-21 DIAGNOSIS — E1159 Type 2 diabetes mellitus with other circulatory complications: Secondary | ICD-10-CM

## 2024-01-04 ENCOUNTER — Ambulatory Visit: Attending: Cardiology | Admitting: Cardiology

## 2024-01-23 ENCOUNTER — Other Ambulatory Visit: Payer: Self-pay | Admitting: Family Medicine

## 2024-01-23 DIAGNOSIS — E1159 Type 2 diabetes mellitus with other circulatory complications: Secondary | ICD-10-CM

## 2024-02-28 ENCOUNTER — Other Ambulatory Visit: Payer: Self-pay | Admitting: Family Medicine

## 2024-02-28 DIAGNOSIS — E1159 Type 2 diabetes mellitus with other circulatory complications: Secondary | ICD-10-CM

## 2024-04-12 ENCOUNTER — Ambulatory Visit: Admitting: Family Medicine

## 2024-04-28 ENCOUNTER — Ambulatory Visit: Payer: Self-pay | Admitting: Cardiology
